# Patient Record
Sex: Male | Born: 1965
Health system: Southern US, Community
[De-identification: ages and names within clinical notes are randomized; demographics above are authoritative.]

## PROBLEM LIST (undated history)

## (undated) DIAGNOSIS — I10 Essential (primary) hypertension: Secondary | ICD-10-CM

## (undated) DIAGNOSIS — K219 Gastro-esophageal reflux disease without esophagitis: Secondary | ICD-10-CM

## (undated) DIAGNOSIS — E785 Hyperlipidemia, unspecified: Secondary | ICD-10-CM

## (undated) DIAGNOSIS — T7840XA Allergy, unspecified, initial encounter: Secondary | ICD-10-CM

## (undated) HISTORY — PX: VASECTOMY: SHX75

## (undated) HISTORY — DX: Allergy, unspecified, initial encounter: T78.40XA

## (undated) HISTORY — DX: Essential (primary) hypertension: I10

## (undated) HISTORY — PX: COLONOSCOPY WITH PROPOFOL: SHX5780

## (undated) HISTORY — DX: Hyperlipidemia, unspecified: E78.5

---

## 2011-05-13 ENCOUNTER — Ambulatory Visit: Payer: Self-pay | Admitting: Family Medicine

## 2014-09-06 ENCOUNTER — Ambulatory Visit: Payer: Self-pay | Admitting: Gastroenterology

## 2014-12-04 ENCOUNTER — Ambulatory Visit: Payer: Self-pay | Admitting: Family Medicine

## 2015-04-29 ENCOUNTER — Ambulatory Visit (INDEPENDENT_AMBULATORY_CARE_PROVIDER_SITE_OTHER): Payer: Managed Care, Other (non HMO) | Admitting: Family Medicine

## 2015-04-29 ENCOUNTER — Encounter: Payer: Self-pay | Admitting: Family Medicine

## 2015-04-29 VITALS — BP 152/91 | HR 79 | Temp 98.0°F | Ht 70.0 in | Wt 212.0 lb

## 2015-04-29 DIAGNOSIS — E785 Hyperlipidemia, unspecified: Secondary | ICD-10-CM | POA: Diagnosis not present

## 2015-04-29 DIAGNOSIS — I10 Essential (primary) hypertension: Secondary | ICD-10-CM

## 2015-04-29 DIAGNOSIS — S86911A Strain of unspecified muscle(s) and tendon(s) at lower leg level, right leg, initial encounter: Secondary | ICD-10-CM

## 2015-04-29 DIAGNOSIS — G479 Sleep disorder, unspecified: Secondary | ICD-10-CM

## 2015-04-29 DIAGNOSIS — R5383 Other fatigue: Secondary | ICD-10-CM

## 2015-04-29 DIAGNOSIS — S86811A Strain of other muscle(s) and tendon(s) at lower leg level, right leg, initial encounter: Secondary | ICD-10-CM | POA: Diagnosis not present

## 2015-04-29 DIAGNOSIS — G8929 Other chronic pain: Secondary | ICD-10-CM

## 2015-04-29 DIAGNOSIS — M25521 Pain in right elbow: Secondary | ICD-10-CM | POA: Diagnosis not present

## 2015-04-29 MED ORDER — AMLODIPINE BESYLATE 5 MG PO TABS: 5.0000 mg | ORAL_TABLET | Freq: Every day | ORAL | Status: DC

## 2015-04-29 MED ORDER — MELOXICAM 15 MG PO TABS: 15.0000 mg | ORAL_TABLET | Freq: Every day | ORAL | Status: DC

## 2015-04-29 NOTE — Assessment & Plan Note (Signed)
The current medical regimen is effective;  continue present plan and medications.  

## 2015-04-29 NOTE — Assessment & Plan Note (Signed)
Poor control off meds

## 2015-04-29 NOTE — Progress Notes (Signed)
BP 152/91 mmHg  Pulse 79  Temp(Src) 98 F (36.7 C)  Ht 5\' 10"  (1.778 m)  Wt 212 lb (96.163 kg)  BMI 30.42 kg/m2  SpO2 97%   Subjective:    Patient ID: David Barrera, male    DOB: 10/05/1966, 49 y.o.   MRN: [id number]  HPI: David Barrera is a 49 y.o. male  Chief Complaint  Patient presents with  . Hypertension    refills  . Knee Pain    right  . Elbow Pain    right   Out of amlodipine  This last week Does not know if helped did not check BP on meds No side effects  Did not get sleep study yet Will re order see note in practice partner  Knee 1 week medial swelling  No trauma sligh click  No giving way Worse with standing and gets stiff  Rt elbow clasic tennis elbow  Relevant past medical, surgical, family and social history reviewed and updated as indicated. Interim medical history since our last visit reviewed. Allergies and medications reviewed and updated.  Review of Systems  Constitutional: Negative.   Respiratory: Negative.   Cardiovascular: Negative.     Per HPI unless specifically indicated above     Objective:    BP 152/91 mmHg  Pulse 79  Temp(Src) 98 F (36.7 C)  Ht 5\' 10"  (1.778 m)  Wt 212 lb (96.163 kg)  BMI 30.42 kg/m2  SpO2 97%  Wt Readings from Last 3 Encounters:  04/29/15 212 lb (96.163 kg)  12/04/14 213 lb (96.616 kg)    Physical Exam  Constitutional: He is oriented to person, place, and time. He appears well-developed and well-nourished. No distress.  HENT:  Head: Normocephalic and atraumatic.  Right Ear: Hearing normal.  Left Ear: Hearing normal.  Nose: Nose normal.  Eyes: Conjunctivae and lids are normal. Right eye exhibits no discharge. Left eye exhibits no discharge. No scleral icterus.  Pulmonary/Chest: Effort normal. No respiratory distress.  Musculoskeletal: Normal range of motion.  Rt lateral pain with resistance Rt knee medial collateral  pain  Neurological: He is alert and oriented to person, place, and time.   Skin: Skin is intact. No rash noted.  Psychiatric: He has a normal mood and affect. His speech is normal and behavior is normal. Judgment and thought content normal. Cognition and memory are normal.    No results found for this or any previous visit.    Assessment & Plan:   Problem List Items Addressed This Visit      Cardiovascular and Mediastinum   Hypertension - Primary    Poor control off meds      Relevant Medications   amLODipine (NORVASC) 5 MG tablet     Other   Hyperlipidemia    The current medical regimen is effective;  continue present plan and medications.       Relevant Medications   amLODipine (NORVASC) 5 MG tablet    Other Visit Diagnoses    Fatigue due to sleep pattern disturbance        apt for sleep study as sx most likley OSA    Relevant Orders    Ambulatory referral to Sleep Studies    Elbow pain, chronic, right        lat epicondlitis discussed care and tx    Relevant Medications    meloxicam (MOBIC) 15 MG tablet    Knee strain, right, initial encounter        discussed care and  tx    Relevant Medications    meloxicam (MOBIC) 15 MG tablet        Follow up plan: Return in about 3 months (around 07/30/2015) for Physical Exam.

## 2015-05-02 ENCOUNTER — Other Ambulatory Visit: Payer: Self-pay | Admitting: Family Medicine

## 2015-06-12 ENCOUNTER — Telehealth: Payer: Self-pay

## 2015-06-12 NOTE — Telephone Encounter (Signed)
Patient has not heard about his sleep study, please follow up.  Thanks

## 2015-06-12 NOTE — Telephone Encounter (Signed)
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

## 2015-06-22 ENCOUNTER — Other Ambulatory Visit: Payer: Self-pay | Admitting: Family Medicine

## 2015-06-24 ENCOUNTER — Ambulatory Visit (INDEPENDENT_AMBULATORY_CARE_PROVIDER_SITE_OTHER): Payer: Managed Care, Other (non HMO) | Admitting: Family Medicine

## 2015-06-24 ENCOUNTER — Encounter: Payer: Self-pay | Admitting: Family Medicine

## 2015-06-24 VITALS — BP 126/83 | HR 89 | Temp 97.4°F | Wt 214.8 lb

## 2015-06-24 DIAGNOSIS — M25561 Pain in right knee: Secondary | ICD-10-CM | POA: Diagnosis not present

## 2015-06-24 MED ORDER — PREDNISONE 10 MG PO TABS: ORAL_TABLET | ORAL | Status: DC

## 2015-06-24 NOTE — Progress Notes (Signed)
BP 126/83 mmHg  Pulse 89  Temp(Src) 97.4 F (36.3 C)  Wt 214 lb 12.8 oz (97.433 kg)  SpO2 98%   Subjective:    Patient ID: David Barrera, male    DOB: Jul 29, 1966, 49 y.o.   MRN: [id number]  HPI: David Barrera is a 49 y.o. male  Chief Complaint  Patient presents with  . Knee Pain    right knee, he has seen Dr.Crissman for this, he was given Meloxicam. He states that the pain is sharpe at times.   KNEE PAIN Duration: 1 months Involved knee: right Mechanism of injury: unknown Location:medial Onset: sudden Severity: moderate worse with standing and walking Quality:  sharp Frequency: constant Radiation: yes Aggravating factors: weight bearing  Alleviating factors: nothing  Status: stable Treatments attempted: meloxicam and rest  Relief with NSAIDs?:  no Weakness with weight bearing or walking: no Sensation of giving way: no Locking: no Popping: no Bruising: no Swelling: yes Redness: no Paresthesias/decreased sensation: no Fevers: no  Relevant past medical, surgical, family and social history reviewed and updated as indicated. Interim medical history since our last visit reviewed. Allergies and medications reviewed and updated.  Review of Systems  Constitutional: Negative.   Respiratory: Negative.   Cardiovascular: Negative.   Musculoskeletal: Positive for arthralgias. Negative for myalgias, back pain, joint swelling, gait problem, neck pain and neck stiffness.  Psychiatric/Behavioral: Negative.     Per HPI unless specifically indicated above     Objective:    BP 126/83 mmHg  Pulse 89  Temp(Src) 97.4 F (36.3 C)  Wt 214 lb 12.8 oz (97.433 kg)  SpO2 98%  Wt Readings from Last 3 Encounters:  06/24/15 214 lb 12.8 oz (97.433 kg)  04/29/15 212 lb (96.163 kg)  12/04/14 213 lb (96.616 kg)    Physical Exam  Constitutional: He is oriented to person, place, and time. He appears well-developed and well-nourished. No distress.  HENT:  Head: Normocephalic and  atraumatic.  Right Ear: Hearing normal.  Left Ear: Hearing normal.  Nose: Nose normal.  Eyes: Conjunctivae and lids are normal. Right eye exhibits no discharge. Left eye exhibits no discharge. No scleral icterus.  Pulmonary/Chest: Effort normal. No respiratory distress.  Musculoskeletal: He exhibits tenderness.       Right knee: He exhibits normal range of motion, no swelling, no effusion, no ecchymosis, no deformity, no laceration, no erythema, normal alignment, no LCL laxity, normal patellar mobility, no bony tenderness, normal meniscus and no MCL laxity. Tenderness found. MCL tenderness noted. No medial joint line, no lateral joint line, no LCL and no patellar tendon tenderness noted.       Legs: Neurological: He is alert and oriented to person, place, and time.  Skin: Skin is warm, dry and intact. No rash noted. No erythema. No pallor.  Psychiatric: He has a normal mood and affect. His speech is normal and behavior is normal. Judgment and thought content normal. Cognition and memory are normal.  Nursing note and vitals reviewed.   No results found for this or any previous visit.    Assessment & Plan:   Problem List Items Addressed This Visit      Other   Right knee pain - Primary    Seems to be right at hamstring attachement. Not better on meloxicam. Will do prednisone taper. Will do stretches- given today. If better and returns consider injection at hamstring attachment. No need for x-ray at this time. Continue to monitor. Return if not improving.  Follow up plan: Return if symptoms worsen or fail to improve.

## 2015-06-24 NOTE — Patient Instructions (Signed)
Knee Exercises EXERCISES RANGE OF MOTION (ROM) AND STRETCHING EXERCISES These exercises may help you when beginning to rehabilitate your injury. Your symptoms may resolve with or without further involvement from your physician, physical therapist, or athletic trainer. While completing these exercises, remember:   Restoring tissue flexibility helps normal motion to return to the joints. This allows healthier, less painful movement and activity.  An effective stretch should be held for at least 30 seconds.  A stretch should never be painful. You should only feel a gentle lengthening or release in the stretched tissue. STRETCH - Knee Extension, Prone  Lie on your stomach on a firm surface, such as a bed or countertop. Place your right / left knee and leg just beyond the edge of the surface. You may wish to place a towel under the far end of your right / left thigh for comfort.  Relax your leg muscles and allow gravity to straighten your knee. Your clinician may advise you to add an ankle weight if more resistance is helpful for you.  You should feel a stretch in the back of your right / left knee. Hold this position for __________ seconds. Repeat __________ times. Complete this stretch __________ times per day. * Your physician, physical therapist, or athletic trainer may ask you to add ankle weight to enhance your stretch.  RANGE OF MOTION - Knee Flexion, Active  Lie on your back with both knees straight. (If this causes back discomfort, bend your opposite knee, placing your foot flat on the floor.)  Slowly slide your heel back toward your buttocks until you feel a gentle stretch in the front of your knee or thigh.  Hold for __________ seconds. Slowly slide your heel back to the starting position. Repeat __________ times. Complete this exercise __________ times per day.  STRETCH - Quadriceps, Prone   Lie on your stomach on a firm surface, such as a bed or padded floor.  Bend your right /  left knee and grasp your ankle. If you are unable to reach your ankle or pant leg, use a belt around your foot to lengthen your reach.  Gently pull your heel toward your buttocks. Your knee should not slide out to the side. You should feel a stretch in the front of your thigh and/or knee.  Hold this position for __________ seconds. Repeat __________ times. Complete this stretch __________ times per day.  STRETCH - Hamstrings, Supine   Lie on your back. Loop a belt or towel over the ball of your right / left foot.  Straighten your right / left knee and slowly pull on the belt to raise your leg. Do not allow the right / left knee to bend. Keep your opposite leg flat on the floor.  Raise the leg until you feel a gentle stretch behind your right / left knee or thigh. Hold this position for __________ seconds. Repeat __________ times. Complete this stretch __________ times per day.  STRENGTHENING EXERCISES These exercises may help you when beginning to rehabilitate your injury. They may resolve your symptoms with or without further involvement from your physician, physical therapist, or athletic trainer. While completing these exercises, remember:   Muscles can gain both the endurance and the strength needed for everyday activities through controlled exercises.  Complete these exercises as instructed by your physician, physical therapist, or athletic trainer. Progress the resistance and repetitions only as guided.  You may experience muscle soreness or fatigue, but the pain or discomfort you are trying to eliminate   should never worsen during these exercises. If this pain does worsen, stop and make certain you are following the directions exactly. If the pain is still present after adjustments, discontinue the exercise until you can discuss the trouble with your clinician. STRENGTH - Quadriceps, Isometrics  Lie on your back with your right / left leg extended and your opposite knee  bent.  Gradually tense the muscles in the front of your right / left thigh. You should see either your knee cap slide up toward your hip or increased dimpling just above the knee. This motion will push the back of the knee down toward the floor/mat/bed on which you are lying.  Hold the muscle as tight as you can without increasing your pain for __________ seconds.  Relax the muscles slowly and completely in between each repetition. Repeat __________ times. Complete this exercise __________ times per day.  STRENGTH - Quadriceps, Short Arcs   Lie on your back. Place a __________ inch towel roll under your knee so that the knee slightly bends.  Raise only your lower leg by tightening the muscles in the front of your thigh. Do not allow your thigh to rise.  Hold this position for __________ seconds. Repeat __________ times. Complete this exercise __________ times per day.  OPTIONAL ANKLE WEIGHTS: Begin with ____________________, but DO NOT exceed ____________________. Increase in 1 pound/0.5 kilogram increments.  STRENGTH - Quadriceps, Straight Leg Raises  Quality counts! Watch for signs that the quadriceps muscle is working to insure you are strengthening the correct muscles and not "cheating" by substituting with healthier muscles.  Lay on your back with your right / left leg extended and your opposite knee bent.  Tense the muscles in the front of your right / left thigh. You should see either your knee cap slide up or increased dimpling just above the knee. Your thigh may even quiver.  Tighten these muscles even more and raise your leg 4 to 6 inches off the floor. Hold for __________ seconds.  Keeping these muscles tense, lower your leg.  Relax the muscles slowly and completely in between each repetition. Repeat __________ times. Complete this exercise __________ times per day.  STRENGTH - Hamstring, Curls  Lay on your stomach with your legs extended. (If you lay on a bed, your feet  may hang over the edge.)  Tighten the muscles in the back of your thigh to bend your right / left knee up to 90 degrees. Keep your hips flat on the bed/floor.  Hold this position for __________ seconds.  Slowly lower your leg back to the starting position. Repeat __________ times. Complete this exercise __________ times per day.  OPTIONAL ANKLE WEIGHTS: Begin with ____________________, but DO NOT exceed ____________________. Increase in 1 pound/0.5 kilogram increments.  STRENGTH - Quadriceps, Squats  Stand in a door frame so that your feet and knees are in line with the frame.  Use your hands for balance, not support, on the frame.  Slowly lower your weight, bending at the hips and knees. Keep your lower legs upright so that they are parallel with the door frame. Squat only within the range that does not increase your knee pain. Never let your hips drop below your knees.  Slowly return upright, pushing with your legs, not pulling with your hands. Repeat __________ times. Complete this exercise __________ times per day.  STRENGTH - Quadriceps, Wall Slides  Follow guidelines for form closely. Increased knee pain often results from poorly placed feet or knees.  Lean   against a smooth wall or door and walk your feet out 18-24 inches. Place your feet hip-width apart.  Slowly slide down the wall or door until your knees bend __________ degrees.* Keep your knees over your heels, not your toes, and in line with your hips, not falling to either side.  Hold for __________ seconds. Stand up to rest for __________ seconds in between each repetition. Repeat __________ times. Complete this exercise __________ times per day. * Your physician, physical therapist, or athletic trainer will alter this angle based on your symptoms and progress. Document Released: 09/01/2005 Document Revised: 03/04/2014 Document Reviewed: 01/30/2009 [employer] Patient Information 2015 [employer]. This information is not  intended to replace advice given to you by your health care provider. Make sure you discuss any questions you have with your health care provider.  

## 2015-06-24 NOTE — Assessment & Plan Note (Signed)
Seems to be right at hamstring attachement. Not better on meloxicam. Will do prednisone taper. Will do stretches- given today. If better and returns consider injection at hamstring attachment. No need for x-ray at this time. Continue to monitor. Return if not improving.

## 2015-07-30 ENCOUNTER — Telehealth: Payer: Self-pay | Admitting: Family Medicine

## 2015-07-30 NOTE — Telephone Encounter (Signed)
Call patient regarding sleep study on my desk

## 2015-07-31 ENCOUNTER — Encounter: Payer: Self-pay | Admitting: Family Medicine

## 2015-07-31 ENCOUNTER — Ambulatory Visit (INDEPENDENT_AMBULATORY_CARE_PROVIDER_SITE_OTHER): Payer: Managed Care, Other (non HMO) | Admitting: Family Medicine

## 2015-07-31 VITALS — BP 135/95 | HR 80 | Temp 98.2°F | Ht 69.5 in | Wt 216.6 lb

## 2015-07-31 DIAGNOSIS — E785 Hyperlipidemia, unspecified: Secondary | ICD-10-CM | POA: Diagnosis not present

## 2015-07-31 DIAGNOSIS — M109 Gout, unspecified: Secondary | ICD-10-CM | POA: Insufficient documentation

## 2015-07-31 DIAGNOSIS — M1A00X0 Idiopathic chronic gout, unspecified site, without tophus (tophi): Secondary | ICD-10-CM

## 2015-07-31 DIAGNOSIS — I10 Essential (primary) hypertension: Secondary | ICD-10-CM | POA: Diagnosis not present

## 2015-07-31 DIAGNOSIS — K219 Gastro-esophageal reflux disease without esophagitis: Secondary | ICD-10-CM | POA: Diagnosis not present

## 2015-07-31 DIAGNOSIS — Z0000 Encounter for general adult medical examination without abnormal findings: Secondary | ICD-10-CM

## 2015-07-31 LAB — URINALYSIS, ROUTINE W REFLEX MICROSCOPIC
Bilirubin, UA: NEGATIVE
Glucose, UA: NEGATIVE
Ketones, UA: NEGATIVE
Leukocytes, UA: NEGATIVE
Nitrite, UA: NEGATIVE
Protein, UA: NEGATIVE
RBC, UA: NEGATIVE
Specific Gravity, UA: 1.005 (ref 1.005–1.030)
Urobilinogen, Ur: 0.2 mg/dL (ref 0.2–1.0)
pH, UA: 6.5 (ref 5.0–7.5)

## 2015-07-31 MED ORDER — ATORVASTATIN CALCIUM 20 MG PO TABS: 20.0000 mg | ORAL_TABLET | Freq: Every day | ORAL | Status: DC

## 2015-07-31 MED ORDER — OMEPRAZOLE 20 MG PO CPDR: 20.0000 mg | DELAYED_RELEASE_CAPSULE | Freq: Every day | ORAL | Status: DC

## 2015-07-31 MED ORDER — ALLOPURINOL 100 MG PO TABS: 100.0000 mg | ORAL_TABLET | Freq: Every day | ORAL | Status: DC

## 2015-07-31 MED ORDER — BENAZEPRIL HCL 40 MG PO TABS: 40.0000 mg | ORAL_TABLET | Freq: Every day | ORAL | Status: DC

## 2015-07-31 MED ORDER — ZOLPIDEM TARTRATE 10 MG PO TABS: 10.0000 mg | ORAL_TABLET | Freq: Every evening | ORAL | Status: DC | PRN

## 2015-07-31 MED ORDER — AMLODIPINE BESYLATE 5 MG PO TABS: 5.0000 mg | ORAL_TABLET | Freq: Every day | ORAL | Status: DC

## 2015-07-31 NOTE — Assessment & Plan Note (Signed)
The current medical regimen is effective;  continue present plan and medications.  

## 2015-07-31 NOTE — Progress Notes (Signed)
BP 135/95 mmHg  Pulse 80  Temp(Src) 98.2 F (36.8 C)  Ht 5' 9.5" (1.765 m)  Wt 216 lb 9.6 oz (98.249 kg)  BMI 31.54 kg/m2  SpO2 99%   Subjective:    Patient ID: David Barrera, male    DOB: 1966/01/05, 49 y.o.   MRN: [id number]  HPI: David Barrera is a 49 y.o. male  Chief Complaint  Patient presents with  . Annual Exam   patient doing well still having some right upper mid calf discomfort medicine as prescribed above has not really helped. No swelling noted just nonspecific soreness when he stands up. Otherwise no discomfort.  Patient reviewed sleep apnea study and had poor sleep with hypoxia down to 76 after discussion with Dr.Dellabadela yesterday we will reschedule sleep study with an Ambien to further evaluate. Hopefully split night study will be able to be performed if necessary.  Patient's blood pressure cholesterol doing well No gout issues no side effects from medications Heartburn medication has cured his cough.  Relevant past medical, surgical, family and social history reviewed and updated as indicated. Interim medical history since our last visit reviewed. Allergies and medications reviewed and updated.  Review of Systems  Constitutional: Negative.   HENT: Negative.   Eyes: Negative.   Respiratory: Negative.   Cardiovascular: Negative.   Gastrointestinal: Negative.   Endocrine: Negative.   Genitourinary: Negative.   Musculoskeletal: Negative.   Skin: Negative.   Allergic/Immunologic: Negative.   Neurological: Negative.   Hematological: Negative.   Psychiatric/Behavioral: Negative.     Per HPI unless specifically indicated above     Objective:    BP 135/95 mmHg  Pulse 80  Temp(Src) 98.2 F (36.8 C)  Ht 5' 9.5" (1.765 m)  Wt 216 lb 9.6 oz (98.249 kg)  BMI 31.54 kg/m2  SpO2 99%  Wt Readings from Last 3 Encounters:  07/31/15 216 lb 9.6 oz (98.249 kg)  06/24/15 214 lb 12.8 oz (97.433 kg)  04/29/15 212 lb (96.163 kg)    Physical Exam   Constitutional: He is oriented to person, place, and time. He appears well-developed and well-nourished.  HENT:  Head: Normocephalic and atraumatic.  Right Ear: External ear normal.  Left Ear: External ear normal.  Eyes: Conjunctivae and EOM are normal. Pupils are equal, round, and reactive to light.  Neck: Normal range of motion. Neck supple.  Cardiovascular: Normal rate, regular rhythm, normal heart sounds and intact distal pulses.   Pulmonary/Chest: Effort normal and breath sounds normal.  Abdominal: Soft. Bowel sounds are normal. There is no splenomegaly or hepatomegaly.  Genitourinary: Rectum normal, prostate normal and penis normal.  Musculoskeletal: Normal range of motion.  Neurological: He is alert and oriented to person, place, and time. He has normal reflexes.  Skin: No rash noted. No erythema.  Psychiatric: He has a normal mood and affect. His behavior is normal. Judgment and thought content normal.    No results found for this or any previous visit.    Assessment & Plan:   Problem List Items Addressed This Visit      Cardiovascular and Mediastinum   Hypertension    The current medical regimen is effective;  continue present plan and medications.       Relevant Medications   benazepril (LOTENSIN) 40 MG tablet   atorvastatin (LIPITOR) 20 MG tablet   amLODipine (NORVASC) 5 MG tablet     Digestive   Acid reflux   Relevant Medications   omeprazole (PRILOSEC) 20 MG capsule  Other   Hyperlipidemia    The current medical regimen is effective;  continue present plan and medications.       Relevant Medications   benazepril (LOTENSIN) 40 MG tablet   atorvastatin (LIPITOR) 20 MG tablet   amLODipine (NORVASC) 5 MG tablet   Gout   Relevant Medications   allopurinol (ZYLOPRIM) 100 MG tablet   Other Relevant Orders   Uric acid    Other Visit Diagnoses    PE (physical exam), annual    -  Primary    Relevant Orders    Comprehensive metabolic panel    Lipid  panel    CBC with Differential/Platelet    PSA    Urinalysis, Routine w reflex microscopic (not at [facility])    TSH      follow-up sleep study with Ambien ordered  Follow up plan: Return in about 6 months (around 01/28/2016), or if symptoms worsen or fail to improve, for med check, bmp, lipids, alt, ast.

## 2015-08-01 LAB — CBC WITH DIFFERENTIAL/PLATELET
Basophils Absolute: 0 10*3/uL (ref 0.0–0.2)
Basos: 1 %
EOS (ABSOLUTE): 0.1 10*3/uL (ref 0.0–0.4)
Eos: 2 %
Hematocrit: 37.9 % (ref 37.5–51.0)
Hemoglobin: 13 g/dL (ref 12.6–17.7)
Immature Grans (Abs): 0 10*3/uL (ref 0.0–0.1)
Immature Granulocytes: 0 %
Lymphocytes Absolute: 1.9 10*3/uL (ref 0.7–3.1)
Lymphs: 42 %
MCH: 31.3 pg (ref 26.6–33.0)
MCHC: 34.3 g/dL (ref 31.5–35.7)
MCV: 91 fL (ref 79–97)
Monocytes Absolute: 0.3 10*3/uL (ref 0.1–0.9)
Monocytes: 8 %
Neutrophils Absolute: 2.1 10*3/uL (ref 1.4–7.0)
Neutrophils: 47 %
Platelets: 236 10*3/uL (ref 150–379)
RBC: 4.16 x10E6/uL (ref 4.14–5.80)
RDW: 13.8 % (ref 12.3–15.4)
WBC: 4.4 10*3/uL (ref 3.4–10.8)

## 2015-08-01 LAB — TSH: TSH: 1.51 u[IU]/mL (ref 0.450–4.500)

## 2015-08-01 LAB — COMPREHENSIVE METABOLIC PANEL
ALT: 28 IU/L (ref 0–44)
AST: 24 IU/L (ref 0–40)
Albumin/Globulin Ratio: 2.2 (ref 1.1–2.5)
Albumin: 4.8 g/dL (ref 3.5–5.5)
Alkaline Phosphatase: 55 IU/L (ref 39–117)
BUN/Creatinine Ratio: 15 (ref 9–20)
BUN: 11 mg/dL (ref 6–24)
Bilirubin Total: 0.7 mg/dL (ref 0.0–1.2)
CO2: 26 mmol/L (ref 18–29)
Calcium: 9.5 mg/dL (ref 8.7–10.2)
Chloride: 97 mmol/L (ref 97–108)
Creatinine, Ser: 0.72 mg/dL — ABNORMAL LOW (ref 0.76–1.27)
GFR calc Af Amer: 125 mL/min/{1.73_m2} (ref >59)
GFR calc non Af Amer: 108 mL/min/{1.73_m2} (ref >59)
Globulin, Total: 2.2 g/dL (ref 1.5–4.5)
Glucose: 92 mg/dL (ref 65–99)
Potassium: 4 mmol/L (ref 3.5–5.2)
Sodium: 141 mmol/L (ref 134–144)
Total Protein: 7 g/dL (ref 6.0–8.5)

## 2015-08-01 LAB — LIPID PANEL
Chol/HDL Ratio: 4.8 ratio units (ref 0.0–5.0)
Cholesterol, Total: 250 mg/dL — ABNORMAL HIGH (ref 100–199)
HDL: 52 mg/dL (ref >39)
Triglycerides: 676 mg/dL (ref 0–149)

## 2015-08-01 LAB — PSA: Prostate Specific Ag, Serum: 1.1 ng/mL (ref 0.0–4.0)

## 2015-08-01 LAB — URIC ACID: Uric Acid: 5.7 mg/dL (ref 3.7–8.6)

## 2015-08-04 ENCOUNTER — Telehealth: Payer: Self-pay | Admitting: Family Medicine

## 2015-08-04 DIAGNOSIS — E7800 Pure hypercholesterolemia, unspecified: Secondary | ICD-10-CM

## 2015-08-04 NOTE — Telephone Encounter (Signed)
Phone call Discussed with patient markedly elevated cholesterol and triglycerides. Patient states his taken Lipitor faithfully. We'll recheck lipid panel near future fasting

## 2015-08-04 NOTE — Telephone Encounter (Signed)
-----   Message from Lurlean Horns, CMA sent at 08/04/2015 12:12 PM EDT ----- labs

## 2015-08-13 ENCOUNTER — Other Ambulatory Visit: Payer: Managed Care, Other (non HMO)

## 2015-08-13 DIAGNOSIS — E7800 Pure hypercholesterolemia, unspecified: Secondary | ICD-10-CM

## 2015-08-14 ENCOUNTER — Telehealth: Payer: Self-pay | Admitting: Family Medicine

## 2015-08-14 LAB — LIPID PANEL
Chol/HDL Ratio: 4.1 ratio units (ref 0.0–5.0)
Cholesterol, Total: 215 mg/dL — ABNORMAL HIGH (ref 100–199)
HDL: 52 mg/dL (ref >39)
Triglycerides: 467 mg/dL — ABNORMAL HIGH (ref 0–149)

## 2015-08-14 NOTE — Telephone Encounter (Signed)
Phone call Discussed with patient triglycerides still elevated patient really hasn't adjusted carbohydrate diet discussed risk of pancreatitis potential for treatment with medication patient indicates he wants to do serious diet. He has an appointment this January we'll recheck lipid panel at that time.

## 2015-08-14 NOTE — Telephone Encounter (Signed)
-----   Message from Lurlean HornsNancy H Wilson, CMA sent at 08/14/2015  5:20 PM EDT ----- Labs/ line [id number]

## 2015-09-07 ENCOUNTER — Other Ambulatory Visit: Payer: Self-pay | Admitting: Family Medicine

## 2016-01-19 ENCOUNTER — Encounter: Payer: Self-pay | Admitting: Family Medicine

## 2016-01-19 ENCOUNTER — Ambulatory Visit (INDEPENDENT_AMBULATORY_CARE_PROVIDER_SITE_OTHER): Payer: Managed Care, Other (non HMO) | Admitting: Family Medicine

## 2016-01-19 VITALS — BP 142/96 | HR 92 | Temp 97.9°F | Ht 69.7 in | Wt 215.0 lb

## 2016-01-19 DIAGNOSIS — M1A00X0 Idiopathic chronic gout, unspecified site, without tophus (tophi): Secondary | ICD-10-CM | POA: Diagnosis not present

## 2016-01-19 DIAGNOSIS — R1011 Right upper quadrant pain: Secondary | ICD-10-CM

## 2016-01-19 DIAGNOSIS — I10 Essential (primary) hypertension: Secondary | ICD-10-CM

## 2016-01-19 DIAGNOSIS — Z113 Encounter for screening for infections with a predominantly sexual mode of transmission: Secondary | ICD-10-CM | POA: Diagnosis not present

## 2016-01-19 DIAGNOSIS — E785 Hyperlipidemia, unspecified: Secondary | ICD-10-CM | POA: Diagnosis not present

## 2016-01-19 DIAGNOSIS — R109 Unspecified abdominal pain: Secondary | ICD-10-CM | POA: Insufficient documentation

## 2016-01-19 DIAGNOSIS — R1010 Upper abdominal pain, unspecified: Secondary | ICD-10-CM | POA: Insufficient documentation

## 2016-01-19 NOTE — Assessment & Plan Note (Addendum)
Triglycerides to high to measure

## 2016-01-19 NOTE — Progress Notes (Signed)
BP 142/96 mmHg  Pulse 92  Temp(Src) 97.9 F (36.6 C)  Ht 5' 9.7" (1.77 m)  Wt 215 lb (97.523 kg)  BMI 31.13 kg/m2  SpO2 99%   Subjective:    Patient ID: David Barrera, male    DOB: 08/06/1966, 50 y.o.   MRN: [id number]  HPI: David Barrera is a 50 y.o. male  Chief Complaint  Patient presents with  . Hypertension    no meds this morning  . Hyperlipidemia   patient follow-up blood pressure cholesterol no complaints from medications ran out of amlodipine and hasn't taking Benzapril either today. Blood pressure is up. All taking medications Blood pressure is been good. No complaints from the Lipitor or allopurinol with no gout symptoms or signs. Some right upper quadrant abdominal pain nonspecific more intermittent having more loose stools and constipation no radiating pain to his back not associated with foods No blood in stool or urine is been ongoing for 2 months.   Relevant past medical, surgical, family and social history reviewed and updated as indicated. Interim medical history since our last visit reviewed. Allergies and medications reviewed and updated.  Review of Systems  Constitutional: Negative.   Respiratory: Negative.   Cardiovascular: Negative.     Per HPI unless specifically indicated above     Objective:    BP 142/96 mmHg  Pulse 92  Temp(Src) 97.9 F (36.6 C)  Ht 5' 9.7" (1.77 m)  Wt 215 lb (97.523 kg)  BMI 31.13 kg/m2  SpO2 99%  Wt Readings from Last 3 Encounters:  01/19/16 215 lb (97.523 kg)  07/31/15 216 lb 9.6 oz (98.249 kg)  06/24/15 214 lb 12.8 oz (97.433 kg)    Physical Exam  Constitutional: He is oriented to person, place, and time. He appears well-developed and well-nourished. No distress.  HENT:  Head: Normocephalic and atraumatic.  Right Ear: Hearing normal.  Left Ear: Hearing normal.  Nose: Nose normal.  Eyes: Conjunctivae and lids are normal. Right eye exhibits no discharge. Left eye exhibits no discharge. No scleral icterus.   Cardiovascular: Normal rate, regular rhythm and normal heart sounds.   Pulmonary/Chest: Effort normal and breath sounds normal. No respiratory distress.  Musculoskeletal: Normal range of motion.  Neurological: He is alert and oriented to person, place, and time.  Skin: Skin is intact. No rash noted.  Psychiatric: He has a normal mood and affect. His speech is normal and behavior is normal. Judgment and thought content normal. Cognition and memory are normal.    Results for orders placed or performed in visit on 08/13/15  Lipid panel  Result Value Ref Range   Cholesterol, Total 215 (H) 100 - 199 mg/dL   Triglycerides 629 (H) 0 - 149 mg/dL   HDL 52 >52 mg/dL   VLDL Cholesterol Cal Comment 5 - 40 mg/dL   LDL Calculated Comment 0 - 99 mg/dL   Chol/HDL Ratio 4.1 0.0 - 5.0 ratio units      Assessment & Plan:   Problem List Items Addressed This Visit      Cardiovascular and Mediastinum   Hypertension    Poor control today but patient not taking medications will recheck on medications.      Relevant Orders   LP+ALT+AST Piccolo, Waived   Basic metabolic panel     Other   Hyperlipidemia - Primary    Triglycerides to high to measure      Relevant Orders   LP+ALT+AST Piccolo, [employer]   Basic metabolic panel  Gout    The current medical regimen is effective;  continue present plan and medications.       Relevant Orders   Uric acid   Abdominal pain    Abdominal pain nonspecific concerned may be low-grade pancreatitis will check amylase lipase discuss low triglyceride diet.      Relevant Orders   Amylase   Lipase    Other Visit Diagnoses    Routine screening for STI (sexually transmitted infection)        Relevant Orders    Hepatitis C Antibody        Follow up plan: Return in about 6 months (around 07/21/2016), or if symptoms worsen or fail to improve, for PE, Physical Exam.

## 2016-01-19 NOTE — Assessment & Plan Note (Signed)
Poor control today but patient not taking medications will recheck on medications.

## 2016-01-19 NOTE — Assessment & Plan Note (Signed)
Abdominal pain nonspecific concerned may be low-grade pancreatitis will check amylase lipase discuss low triglyceride diet.

## 2016-01-19 NOTE — Assessment & Plan Note (Signed)
The current medical regimen is effective;  continue present plan and medications.  

## 2016-01-20 LAB — LIPASE: Lipase: 21 U/L (ref 0–59)

## 2016-01-20 LAB — BASIC METABOLIC PANEL
BUN/Creatinine Ratio: 25 — ABNORMAL HIGH (ref 9–20)
BUN: 17 mg/dL (ref 6–24)
CO2: 26 mmol/L (ref 18–29)
Calcium: 9.6 mg/dL (ref 8.7–10.2)
Chloride: 94 mmol/L — ABNORMAL LOW (ref 96–106)
Creatinine, Ser: 0.67 mg/dL — ABNORMAL LOW (ref 0.76–1.27)
GFR calc Af Amer: 129 mL/min/{1.73_m2} (ref >59)
GFR calc non Af Amer: 111 mL/min/{1.73_m2} (ref >59)
Glucose: 98 mg/dL (ref 65–99)
Potassium: 4.6 mmol/L (ref 3.5–5.2)
Sodium: 136 mmol/L (ref 134–144)

## 2016-01-20 LAB — HCV ANTIBODY: Hep C Virus Ab: <0.1 s/co ratio (ref 0.0–0.9)

## 2016-01-20 LAB — URIC ACID: Uric Acid: 5.8 mg/dL (ref 3.7–8.6)

## 2016-01-20 LAB — AMYLASE: Amylase: 74 U/L (ref 31–124)

## 2016-01-21 ENCOUNTER — Telehealth: Payer: Self-pay | Admitting: Family Medicine

## 2016-01-21 DIAGNOSIS — E785 Hyperlipidemia, unspecified: Secondary | ICD-10-CM

## 2016-01-21 LAB — LIPID PANEL W/O CHOL/HDL RATIO
Cholesterol, Total: 230 mg/dL — ABNORMAL HIGH (ref 100–199)
HDL: 42 mg/dL (ref >39)
Triglycerides: 993 mg/dL (ref 0–149)

## 2016-01-21 LAB — SPECIMEN STATUS REPORT

## 2016-01-21 MED ORDER — ATORVASTATIN CALCIUM 40 MG PO TABS: 20.0000 mg | ORAL_TABLET | Freq: Every day | ORAL | Status: DC

## 2016-01-21 NOTE — Telephone Encounter (Signed)
Please double check that he has been taking his cholesterol medicine. His trigylcerides were very very high. If he has, he should start taking 2 of the lipitor at night. If he hasn't, he should restart it. I'll send a refill though if needed. Please have him come back in 1 month for cholesterol recheck, and make sure that he comes in FASTING.

## 2016-01-21 NOTE — Telephone Encounter (Signed)
Increased to 40mg  daily. Rx sent to his pharmacy

## 2016-01-22 LAB — LP+ALT+AST PICCOLO, WAIVED
ALT (SGPT) Piccolo, Waived: 34 U/L (ref 10–47)
AST (SGOT) Piccolo, Waived: 39 U/L — ABNORMAL HIGH (ref 11–38)

## 2016-01-26 ENCOUNTER — Telehealth: Payer: Self-pay | Admitting: Family Medicine

## 2016-01-26 DIAGNOSIS — I10 Essential (primary) hypertension: Secondary | ICD-10-CM

## 2016-01-26 MED ORDER — AMLODIPINE BESYLATE 5 MG PO TABS: 5.0000 mg | ORAL_TABLET | Freq: Every day | ORAL | Status: DC

## 2016-01-26 NOTE — Telephone Encounter (Signed)
[facility]

## 2016-03-04 ENCOUNTER — Other Ambulatory Visit: Payer: Managed Care, Other (non HMO)

## 2016-03-04 DIAGNOSIS — E785 Hyperlipidemia, unspecified: Secondary | ICD-10-CM

## 2016-03-05 ENCOUNTER — Telehealth: Payer: Self-pay | Admitting: Family Medicine

## 2016-03-05 DIAGNOSIS — E785 Hyperlipidemia, unspecified: Secondary | ICD-10-CM

## 2016-03-05 LAB — LIPID PANEL W/O CHOL/HDL RATIO
Cholesterol, Total: 215 mg/dL — ABNORMAL HIGH (ref 100–199)
HDL: 56 mg/dL (ref >39)
Triglycerides: 414 mg/dL — ABNORMAL HIGH (ref 0–149)

## 2016-03-05 MED ORDER — ATORVASTATIN CALCIUM 40 MG PO TABS: 40.0000 mg | ORAL_TABLET | Freq: Every day | ORAL | Status: DC

## 2016-03-05 NOTE — Telephone Encounter (Signed)
Called patient, no answer, left a message for patient to return my call.  

## 2016-03-05 NOTE — Telephone Encounter (Signed)
Please let him know that his cholesterol is better, but still high. Please make sure he's taking 40mg  of his atorvastatin (a whole pill) and we'll recheck it at his follow up with Dr. Dossie Arbourrissman. Thanks!

## 2016-03-05 NOTE — Telephone Encounter (Signed)
Patient notified, verbal order from Dr.johnson for patient to take 1.5 tablets, if he gets body aches, he can go back to taking 1 tablet a day.

## 2016-07-15 ENCOUNTER — Encounter (INDEPENDENT_AMBULATORY_CARE_PROVIDER_SITE_OTHER): Payer: Self-pay

## 2016-08-05 ENCOUNTER — Ambulatory Visit (INDEPENDENT_AMBULATORY_CARE_PROVIDER_SITE_OTHER): Payer: Managed Care, Other (non HMO) | Admitting: Family Medicine

## 2016-08-05 ENCOUNTER — Encounter: Payer: Self-pay | Admitting: Family Medicine

## 2016-08-05 VITALS — BP 126/76 | HR 89 | Temp 98.2°F | Ht 70.1 in | Wt 215.4 lb

## 2016-08-05 DIAGNOSIS — Z0000 Encounter for general adult medical examination without abnormal findings: Secondary | ICD-10-CM | POA: Diagnosis not present

## 2016-08-05 DIAGNOSIS — E7800 Pure hypercholesterolemia, unspecified: Secondary | ICD-10-CM

## 2016-08-05 DIAGNOSIS — Z23 Encounter for immunization: Secondary | ICD-10-CM

## 2016-08-05 DIAGNOSIS — M1A00X0 Idiopathic chronic gout, unspecified site, without tophus (tophi): Secondary | ICD-10-CM

## 2016-08-05 DIAGNOSIS — R1011 Right upper quadrant pain: Secondary | ICD-10-CM

## 2016-08-05 DIAGNOSIS — I10 Essential (primary) hypertension: Secondary | ICD-10-CM

## 2016-08-05 DIAGNOSIS — K219 Gastro-esophageal reflux disease without esophagitis: Secondary | ICD-10-CM

## 2016-08-05 LAB — URINALYSIS, ROUTINE W REFLEX MICROSCOPIC
Bilirubin, UA: NEGATIVE
Glucose, UA: NEGATIVE
Ketones, UA: NEGATIVE
Leukocytes, UA: NEGATIVE
Nitrite, UA: NEGATIVE
Protein, UA: NEGATIVE
RBC, UA: NEGATIVE
Specific Gravity, UA: 1.005 (ref 1.005–1.030)
Urobilinogen, Ur: 0.2 mg/dL (ref 0.2–1.0)
pH, UA: 6.5 (ref 5.0–7.5)

## 2016-08-05 MED ORDER — AMLODIPINE BESYLATE 5 MG PO TABS: 5.0000 mg | ORAL_TABLET | Freq: Every day | ORAL | 4 refills | Status: DC

## 2016-08-05 MED ORDER — ALLOPURINOL 100 MG PO TABS: 100.0000 mg | ORAL_TABLET | Freq: Every day | ORAL | 4 refills | Status: DC

## 2016-08-05 MED ORDER — OMEPRAZOLE 20 MG PO CPDR: 20.0000 mg | DELAYED_RELEASE_CAPSULE | Freq: Every day | ORAL | 4 refills | Status: DC

## 2016-08-05 MED ORDER — BENAZEPRIL HCL 40 MG PO TABS: 40.0000 mg | ORAL_TABLET | Freq: Every day | ORAL | 4 refills | Status: DC

## 2016-08-05 NOTE — Assessment & Plan Note (Signed)
The current medical regimen is effective;  continue present plan and medications.  

## 2016-08-05 NOTE — Assessment & Plan Note (Signed)
Intermittent abdominal pain we will observe symptoms discussed diet weight loss elevated triglycerides patient had normal colonoscopy

## 2016-08-05 NOTE — Assessment & Plan Note (Signed)
Discussed cholesterol and recommended not to use red rice she's because of no safety studies Will await results of cholesterol and then review for appropriate medications

## 2016-08-05 NOTE — Progress Notes (Signed)
BP 126/76 (BP Location: Left Arm, Patient Position: Sitting, Cuff Size: Normal)   Pulse 89   Temp 98.2 F (36.8 C)   Ht 5' 10.1" (1.781 m)   Wt 215 lb 6.4 oz (97.7 kg)   SpO2 98%   BMI 30.82 kg/m    Subjective:    Patient ID: David Barrera, male    DOB: 08/20/1966, 50 y.o.   MRN: [id number]  HPI: David EarlsDonald W Sarria is a 50 y.o. male  Chief Complaint  Patient presents with  . Annual Exam   Patient feeling very well was having some myalgias from Lipitor stopping that is felt better than he has a long time. Has started a over-the-counter supplement containing red rice yeast for cholesterol and triglycerides Patient does have an intermittent right abdominal pain sometimes radiation down into his right lower quadrant not associated with meals or time of day day or night. Reflux well controlled No gout symptoms Relevant past medical, surgical, family and social history reviewed and updated as indicated. Interim medical history since our last visit reviewed. Allergies and medications reviewed and updated.  Review of Systems  Constitutional: Negative.   HENT: Negative.   Eyes: Negative.   Respiratory: Negative.   Cardiovascular: Negative.   Gastrointestinal: Negative.   Endocrine: Negative.   Genitourinary: Negative.   Musculoskeletal: Negative.   Skin: Negative.   Allergic/Immunologic: Negative.   Neurological: Negative.   Hematological: Negative.   Psychiatric/Behavioral: Negative.     Per HPI unless specifically indicated above     Objective:    BP 126/76 (BP Location: Left Arm, Patient Position: Sitting, Cuff Size: Normal)   Pulse 89   Temp 98.2 F (36.8 C)   Ht 5' 10.1" (1.781 m)   Wt 215 lb 6.4 oz (97.7 kg)   SpO2 98%   BMI 30.82 kg/m   Wt Readings from Last 3 Encounters:  08/05/16 215 lb 6.4 oz (97.7 kg)  01/19/16 215 lb (97.5 kg)  07/31/15 216 lb 9.6 oz (98.2 kg)    Physical Exam  Constitutional: He is oriented to person, place, and time. He appears  well-developed and well-nourished.  HENT:  Head: Normocephalic and atraumatic.  Right Ear: External ear normal.  Left Ear: External ear normal.  Eyes: Conjunctivae and EOM are normal. Pupils are equal, round, and reactive to light.  Neck: Normal range of motion. Neck supple.  Cardiovascular: Normal rate, regular rhythm, normal heart sounds and intact distal pulses.   Pulmonary/Chest: Effort normal and breath sounds normal.  Abdominal: Soft. Bowel sounds are normal. There is no splenomegaly or hepatomegaly.  Genitourinary: Rectum normal, prostate normal and penis normal.  Musculoskeletal: Normal range of motion.  Neurological: He is alert and oriented to person, place, and time. He has normal reflexes.  Skin: No rash noted. No erythema.  Psychiatric: He has a normal mood and affect. His behavior is normal. Judgment and thought content normal.    Results for orders placed or performed in visit on 03/04/16  Lipid Panel w/o Chol/HDL Ratio  Result Value Ref Range   Cholesterol, Total 215 (H) 100 - 199 mg/dL   Triglycerides 329414 (H) 0 - 149 mg/dL   HDL 56 >51>39 mg/dL   VLDL Cholesterol Cal Comment 5 - 40 mg/dL   LDL Calculated Comment 0 - 99 mg/dL      Assessment & Plan:   Problem List Items Addressed This Visit      Cardiovascular and Mediastinum   Hypertension    The current medical  regimen is effective;  continue present plan and medications.       Relevant Medications   benazepril (LOTENSIN) 40 MG tablet   amLODipine (NORVASC) 5 MG tablet     Digestive   Acid reflux   Relevant Medications   omeprazole (PRILOSEC) 20 MG capsule     Other   Hyperlipidemia    Discussed cholesterol and recommended not to use red rice she's because of no safety studies Will await results of cholesterol and then review for appropriate medications      Relevant Medications   benazepril (LOTENSIN) 40 MG tablet   amLODipine (NORVASC) 5 MG tablet   Gout    The current medical regimen is  effective;  continue present plan and medications.       Relevant Medications   allopurinol (ZYLOPRIM) 100 MG tablet   Abdominal pain    Intermittent abdominal pain we will observe symptoms discussed diet weight loss elevated triglycerides patient had normal colonoscopy       Other Visit Diagnoses    Need for influenza vaccination    -  Primary   Relevant Orders   Flu Vaccine QUAD 36+ mos PF IM (Fluarix & Fluzone Quad PF) (Completed)   Annual physical exam       Relevant Orders   Flu Vaccine QUAD 36+ mos PF IM (Fluarix & Fluzone Quad PF) (Completed)   CBC with Differential/Platelet   Comprehensive metabolic panel   Lipid panel   PSA   TSH   Urinalysis, Routine w reflex microscopic (not at [facility])       Follow up plan: Return in about 4 weeks (around 09/02/2016) for Lipids, ALT, AST reviewed in medications which will be started after seeing what blood work looks l.

## 2016-08-05 NOTE — Patient Instructions (Addendum)

## 2016-08-06 LAB — COMPREHENSIVE METABOLIC PANEL
ALT: 23 IU/L (ref 0–44)
AST: 22 IU/L (ref 0–40)
Albumin/Globulin Ratio: 1.9 (ref 1.2–2.2)
Albumin: 4.8 g/dL (ref 3.5–5.5)
Alkaline Phosphatase: 58 IU/L (ref 39–117)
BUN/Creatinine Ratio: 17 (ref 9–20)
BUN: 13 mg/dL (ref 6–24)
Bilirubin Total: 0.5 mg/dL (ref 0.0–1.2)
CO2: 23 mmol/L (ref 18–29)
Calcium: 10 mg/dL (ref 8.7–10.2)
Chloride: 93 mmol/L — ABNORMAL LOW (ref 96–106)
Creatinine, Ser: 0.76 mg/dL (ref 0.76–1.27)
GFR calc Af Amer: 121 mL/min/{1.73_m2} (ref >59)
GFR calc non Af Amer: 105 mL/min/{1.73_m2} (ref >59)
Globulin, Total: 2.5 g/dL (ref 1.5–4.5)
Glucose: 105 mg/dL — ABNORMAL HIGH (ref 65–99)
Potassium: 4.4 mmol/L (ref 3.5–5.2)
Sodium: 135 mmol/L (ref 134–144)
Total Protein: 7.3 g/dL (ref 6.0–8.5)

## 2016-08-06 LAB — LIPID PANEL
Chol/HDL Ratio: 6.1 ratio units — ABNORMAL HIGH (ref 0.0–5.0)
Cholesterol, Total: 312 mg/dL — ABNORMAL HIGH (ref 100–199)
HDL: 51 mg/dL (ref >39)
Triglycerides: 620 mg/dL (ref 0–149)

## 2016-08-06 LAB — CBC WITH DIFFERENTIAL/PLATELET
Basophils Absolute: 0 10*3/uL (ref 0.0–0.2)
Basos: 0 %
EOS (ABSOLUTE): 0.1 10*3/uL (ref 0.0–0.4)
Eos: 3 %
Hematocrit: 38.8 % (ref 37.5–51.0)
Hemoglobin: 13.4 g/dL (ref 12.6–17.7)
Immature Grans (Abs): 0 10*3/uL (ref 0.0–0.1)
Immature Granulocytes: 0 %
Lymphocytes Absolute: 1.8 10*3/uL (ref 0.7–3.1)
Lymphs: 40 %
MCH: 31 pg (ref 26.6–33.0)
MCHC: 34.5 g/dL (ref 31.5–35.7)
MCV: 90 fL (ref 79–97)
Monocytes Absolute: 0.5 10*3/uL (ref 0.1–0.9)
Monocytes: 10 %
Neutrophils Absolute: 2.1 10*3/uL (ref 1.4–7.0)
Neutrophils: 47 %
Platelets: 283 10*3/uL (ref 150–379)
RBC: 4.32 x10E6/uL (ref 4.14–5.80)
RDW: 13.1 % (ref 12.3–15.4)
WBC: 4.5 10*3/uL (ref 3.4–10.8)

## 2016-08-06 LAB — PSA: Prostate Specific Ag, Serum: 1.3 ng/mL (ref 0.0–4.0)

## 2016-08-06 LAB — TSH: TSH: 2.1 u[IU]/mL (ref 0.450–4.500)

## 2016-08-10 ENCOUNTER — Telehealth: Payer: Self-pay | Admitting: Family Medicine

## 2016-08-10 MED ORDER — ROSUVASTATIN CALCIUM 20 MG PO TABS: 20.0000 mg | ORAL_TABLET | Freq: Every day | ORAL | 3 refills | Status: DC

## 2016-08-10 NOTE — Telephone Encounter (Signed)
Pt called stated he is returning Dr. Christell Faithrissman's call. No further information provided. Thanks.

## 2016-08-10 NOTE — Telephone Encounter (Signed)
Phone call Discussed with patient markedly elevated cholesterol patient had been taking this herbal supplements including red rice yeast this is been stopped at office visit with elevated cholesterol will change to Crestor recheck 1 month with lipid panel, ALT AST.

## 2016-08-18 ENCOUNTER — Other Ambulatory Visit: Payer: Self-pay | Admitting: Family Medicine

## 2016-08-18 DIAGNOSIS — K219 Gastro-esophageal reflux disease without esophagitis: Secondary | ICD-10-CM

## 2016-09-05 ENCOUNTER — Other Ambulatory Visit: Payer: Self-pay | Admitting: Family Medicine

## 2016-09-05 DIAGNOSIS — I10 Essential (primary) hypertension: Secondary | ICD-10-CM

## 2016-09-16 ENCOUNTER — Ambulatory Visit: Payer: Managed Care, Other (non HMO) | Admitting: Family Medicine

## 2016-09-27 ENCOUNTER — Ambulatory Visit: Payer: Managed Care, Other (non HMO) | Admitting: Family Medicine

## 2016-10-06 ENCOUNTER — Ambulatory Visit: Payer: Managed Care, Other (non HMO) | Admitting: Family Medicine

## 2016-10-11 ENCOUNTER — Telehealth: Payer: Self-pay

## 2016-10-11 MED ORDER — ROSUVASTATIN CALCIUM 20 MG PO TABS: 20.0000 mg | ORAL_TABLET | Freq: Every day | ORAL | 4 refills | Status: DC

## 2016-10-11 NOTE — Telephone Encounter (Signed)
90 day supply request for Rosuvastatin 20mg  Cap. Last visit 08/05/2016

## 2016-10-12 ENCOUNTER — Ambulatory Visit (INDEPENDENT_AMBULATORY_CARE_PROVIDER_SITE_OTHER): Payer: Managed Care, Other (non HMO) | Admitting: Family Medicine

## 2016-10-12 VITALS — BP 127/86 | HR 91 | Temp 98.0°F | Ht 70.0 in | Wt 214.8 lb

## 2016-10-12 DIAGNOSIS — E785 Hyperlipidemia, unspecified: Secondary | ICD-10-CM | POA: Diagnosis not present

## 2016-10-12 DIAGNOSIS — I10 Essential (primary) hypertension: Secondary | ICD-10-CM | POA: Diagnosis not present

## 2016-10-12 DIAGNOSIS — R1011 Right upper quadrant pain: Secondary | ICD-10-CM | POA: Diagnosis not present

## 2016-10-12 LAB — LP+ALT+AST PICCOLO, WAIVED
ALT (SGPT) Piccolo, Waived: 31 U/L (ref 10–47)
AST (SGOT) Piccolo, Waived: 36 U/L (ref 11–38)
Cholesterol Piccolo, Waived: 224 mg/dL — ABNORMAL HIGH (ref <200)
Triglycerides Piccolo,Waived: >500 mg/dL — ABNORMAL HIGH (ref <150)

## 2016-10-12 MED ORDER — HYDROCHLOROTHIAZIDE 25 MG PO TABS: 25.0000 mg | ORAL_TABLET | Freq: Every day | ORAL | 4 refills | Status: DC

## 2016-10-12 NOTE — Assessment & Plan Note (Signed)
Patient's cholesterol down dramatically triglycerides patient nonfasting and elevated.

## 2016-10-12 NOTE — Progress Notes (Signed)
BP 127/86 (BP Location: Left Arm, Patient Position: Sitting, Cuff Size: Normal)   Pulse 91   Temp 98 F (36.7 C)   Ht 5\' 10"  (1.778 m)   Wt 214 lb 12.8 oz (97.4 kg)   SpO2 98%   BMI 30.82 kg/m    Subjective:    Patient ID: David Barrera, male    DOB: 1966/08/09, 50 y.o.   MRN: [id number]  HPI: David Barrera is a 50 y.o. male  Chief Complaint  Patient presents with  . Hyperlipidemia   Patient follow-up doing well with Crestor leg ache and symptoms have resolved and not restarted with Crestor. Patient's developed a nonspecific dry cough. No fever or chills or other cold upper respiratory symptoms. Patient's right upper quadrant abdominal pain still persists is not severe may be related to some dietary indiscretion but not sure.  Relevant past medical, surgical, family and social history reviewed and updated as indicated. Interim medical history since our last visit reviewed. Allergies and medications reviewed and updated.  Review of Systems  Constitutional: Negative.   Respiratory: Negative.   Cardiovascular: Negative.     Per HPI unless specifically indicated above     Objective:    BP 127/86 (BP Location: Left Arm, Patient Position: Sitting, Cuff Size: Normal)   Pulse 91   Temp 98 F (36.7 C)   Ht 5\' 10"  (1.778 m)   Wt 214 lb 12.8 oz (97.4 kg)   SpO2 98%   BMI 30.82 kg/m   Wt Readings from Last 3 Encounters:  10/12/16 214 lb 12.8 oz (97.4 kg)  08/05/16 215 lb 6.4 oz (97.7 kg)  01/19/16 215 lb (97.5 kg)    Physical Exam  Constitutional: He is oriented to person, place, and time. He appears well-developed and well-nourished. No distress.  HENT:  Head: Normocephalic and atraumatic.  Right Ear: Hearing normal.  Left Ear: Hearing normal.  Nose: Nose normal.  Eyes: Conjunctivae and lids are normal. Right eye exhibits no discharge. Left eye exhibits no discharge. No scleral icterus.  Cardiovascular: Normal rate, regular rhythm and normal heart sounds.     Pulmonary/Chest: Effort normal and breath sounds normal. No respiratory distress.  Abdominal: Soft. Bowel sounds are normal. He exhibits no distension and no mass. There is no tenderness. There is no rebound and no guarding.  Musculoskeletal: Normal range of motion.  Neurological: He is alert and oriented to person, place, and time.  Skin: Skin is intact. No rash noted.  Psychiatric: He has a normal mood and affect. His speech is normal and behavior is normal. Judgment and thought content normal. Cognition and memory are normal.    Results for orders placed or performed in visit on 08/05/16  CBC with Differential/Platelet  Result Value Ref Range   WBC 4.5 3.4 - 10.8 x10E3/uL   RBC 4.32 4.14 - 5.80 x10E6/uL   Hemoglobin 13.4 12.6 - 17.7 g/dL   Hematocrit 82.9 56.2 - 51.0 %   MCV 90 79 - 97 fL   MCH 31.0 26.6 - 33.0 pg   MCHC 34.5 31.5 - 35.7 g/dL   RDW 13.0 86.5 - 78.4 %   Platelets 283 150 - 379 x10E3/uL   Neutrophils 47 Not Estab. %   Lymphs 40 Not Estab. %   Monocytes 10 Not Estab. %   Eos 3 Not Estab. %   Basos 0 Not Estab. %   Neutrophils Absolute 2.1 1.4 - 7.0 x10E3/uL   Lymphocytes Absolute 1.8 0.7 - 3.1 x10E3/uL  Monocytes Absolute 0.5 0.1 - 0.9 x10E3/uL   EOS (ABSOLUTE) 0.1 0.0 - 0.4 x10E3/uL   Basophils Absolute 0.0 0.0 - 0.2 x10E3/uL   Immature Granulocytes 0 Not Estab. %   Immature Grans (Abs) 0.0 0.0 - 0.1 x10E3/uL  Comprehensive metabolic panel  Result Value Ref Range   Glucose 105 (H) 65 - 99 mg/dL   BUN 13 6 - 24 mg/dL   Creatinine, Ser 7.840.76 0.76 - 1.27 mg/dL   GFR calc non Af Amer 105 >59 mL/min/1.73   GFR calc Af Amer 121 >59 mL/min/1.73   BUN/Creatinine Ratio 17 9 - 20   Sodium 135 134 - 144 mmol/L   Potassium 4.4 3.5 - 5.2 mmol/L   Chloride 93 (L) 96 - 106 mmol/L   CO2 23 18 - 29 mmol/L   Calcium 10.0 8.7 - 10.2 mg/dL   Total Protein 7.3 6.0 - 8.5 g/dL   Albumin 4.8 3.5 - 5.5 g/dL   Globulin, Total 2.5 1.5 - 4.5 g/dL   Albumin/Globulin Ratio 1.9  1.2 - 2.2   Bilirubin Total 0.5 0.0 - 1.2 mg/dL   Alkaline Phosphatase 58 39 - 117 IU/L   AST 22 0 - 40 IU/L   ALT 23 0 - 44 IU/L  Lipid panel  Result Value Ref Range   Cholesterol, Total 312 (H) 100 - 199 mg/dL   Triglycerides 696620 (HH) 0 - 149 mg/dL   HDL 51 >29>39 mg/dL   VLDL Cholesterol Cal Comment 5 - 40 mg/dL   LDL Calculated Comment 0 - 99 mg/dL   Chol/HDL Ratio 6.1 (H) 0.0 - 5.0 ratio units  PSA  Result Value Ref Range   Prostate Specific Ag, Serum 1.3 0.0 - 4.0 ng/mL  TSH  Result Value Ref Range   TSH 2.100 0.450 - 4.500 uIU/mL  Urinalysis, Routine w reflex microscopic ([facility])  Result Value Ref Range   Specific Gravity, UA 1.005 1.005 - 1.030   pH, UA 6.5 5.0 - 7.5   Color, UA Yellow Yellow   Appearance Ur Clear Clear   Leukocytes, UA Negative Negative   Protein, UA Negative Negative/Trace   Glucose, UA Negative Negative   Ketones, UA Negative Negative   RBC, UA Negative Negative   Bilirubin, UA Negative Negative   Urobilinogen, Ur 0.2 0.2 - 1.0 mg/dL   Nitrite, UA Negative Negative      Assessment & Plan:   Problem List Items Addressed This Visit      Cardiovascular and Mediastinum   Hypertension    Discuss hypertension good control but may be side effects from Benzapril will stop Benzapril observe cough may take several weeks to taper down and stop if related to Benzapril. Will start hydrochlorothiazide 25 mg 1 a day.      Relevant Medications   hydrochlorothiazide (HYDRODIURIL) 25 MG tablet     Other   Hyperlipidemia - Primary    Patient's cholesterol down dramatically triglycerides patient nonfasting and elevated.      Relevant Medications   hydrochlorothiazide (HYDRODIURIL) 25 MG tablet   Other Relevant Orders   LP+ALT+AST Piccolo, Waived   Abdominal pain    Discuss nonspecific right upper quadrant abdominal pain may be related to greasy foods and concerned about possibility of gallbladder and gallstones. Will check abdominal ultrasound.        Relevant Orders   US Abdomen Limited RUQ       Follow up plan: Return in about 4 weeks (around 11/09/2016) for BMP,  Lipids, ALT,  AST Will check fasting.

## 2016-10-12 NOTE — Assessment & Plan Note (Signed)
Discuss nonspecific right upper quadrant abdominal pain may be related to greasy foods and concerned about possibility of gallbladder and gallstones. Will check abdominal ultrasound.

## 2016-10-12 NOTE — Assessment & Plan Note (Signed)
Discuss hypertension good control but may be side effects from Benzapril will stop Benzapril observe cough may take several weeks to taper down and stop if related to Benzapril. Will start hydrochlorothiazide 25 mg 1 a day.

## 2016-10-18 ENCOUNTER — Other Ambulatory Visit: Payer: Self-pay | Admitting: Family Medicine

## 2016-10-18 DIAGNOSIS — M1A00X0 Idiopathic chronic gout, unspecified site, without tophus (tophi): Secondary | ICD-10-CM

## 2016-10-27 ENCOUNTER — Ambulatory Visit
Admission: RE | Admit: 2016-10-27 | Discharge: 2016-10-27 | Disposition: A | Discharge status: Home / Self Care | Payer: Managed Care, Other (non HMO) | Source: Ambulatory Visit | Attending: Family Medicine | Admitting: Family Medicine

## 2016-10-27 ENCOUNTER — Telehealth: Payer: Self-pay | Admitting: Family Medicine

## 2016-10-27 DIAGNOSIS — R1011 Right upper quadrant pain: Secondary | ICD-10-CM | POA: Diagnosis not present

## 2016-10-27 NOTE — Telephone Encounter (Signed)
Patient notified

## 2016-10-27 NOTE — Telephone Encounter (Signed)
Please let him know that his ultrasound was negative and that Dr. Dossie Arbourrissman will call him to discuss next steps when he returns.

## 2016-11-02 ENCOUNTER — Other Ambulatory Visit: Payer: Self-pay

## 2016-11-11 ENCOUNTER — Ambulatory Visit: Payer: Managed Care, Other (non HMO) | Admitting: Family Medicine

## 2016-11-30 ENCOUNTER — Ambulatory Visit: Payer: Managed Care, Other (non HMO) | Admitting: Family Medicine

## 2016-12-15 ENCOUNTER — Encounter: Payer: Self-pay | Admitting: Family Medicine

## 2016-12-15 ENCOUNTER — Ambulatory Visit (INDEPENDENT_AMBULATORY_CARE_PROVIDER_SITE_OTHER): Payer: Managed Care, Other (non HMO) | Admitting: Family Medicine

## 2016-12-15 VITALS — BP 151/104 | HR 54 | Ht 72.0 in | Wt 217.0 lb

## 2016-12-15 DIAGNOSIS — I10 Essential (primary) hypertension: Secondary | ICD-10-CM

## 2016-12-15 DIAGNOSIS — E785 Hyperlipidemia, unspecified: Secondary | ICD-10-CM

## 2016-12-15 MED ORDER — AMLODIPINE BESYLATE 10 MG PO TABS: 10.0000 mg | ORAL_TABLET | Freq: Every day | ORAL | 3 refills | Status: DC

## 2016-12-15 NOTE — Assessment & Plan Note (Addendum)
Discuss triglycerides still greater than 500 patient fasting today will continue diet. Patient will add Fish oil up to 4 capsules a day. We will recheck again next office visit with lipid panel, ALT, AST.

## 2016-12-15 NOTE — Assessment & Plan Note (Signed)
Blood pressure poor control will continue current medications and increase amlodipine from 5 to10 mg recheck blood pressure 1 month or so.

## 2016-12-15 NOTE — Progress Notes (Signed)
BP (!) 151/104   Pulse (!) 54   Ht 6' (1.829 m)   Wt 217 lb (98.4 kg)   SpO2 99%   BMI 29.43 kg/m    Subjective:    Patient ID: David Barrera, male    DOB: 04-03-1966, 51 y.o.   MRN: [id number]  HPI: David Barrera is a 50 y.o. male  Chief Complaint  Patient presents with  . Follow-up  . Hyperlipidemia   Patient follow-up cough has gone away with stopping Benzapril. Taking hydrochlorothiazide without problems. Taking other blood pressure medicines without issues or problems either. Abdominal symptoms reviewed ultrasound which was normal of gallbladder with no stones. Patient still has some occasional right upper quadrant discomfort. Not bad enough to further pursue any further imaging or studies at this time. Reviewed blood work which was normal. If problems change will further evaluate. Cholesterol doing well with Crestor no issues Relevant past medical, surgical, family and social history reviewed and updated as indicated. Interim medical history since our last visit reviewed. Allergies and medications reviewed and updated.  Review of Systems  Constitutional: Negative.   Respiratory: Negative.   Cardiovascular: Negative.     Per HPI unless specifically indicated above     Objective:    BP (!) 151/104   Pulse (!) 54   Ht 6' (1.829 m)   Wt 217 lb (98.4 kg)   SpO2 99%   BMI 29.43 kg/m   Wt Readings from Last 3 Encounters:  12/15/16 217 lb (98.4 kg)  10/12/16 214 lb 12.8 oz (97.4 kg)  08/05/16 215 lb 6.4 oz (97.7 kg)    Physical Exam  Constitutional: He is oriented to person, place, and time. He appears well-developed and well-nourished. No distress.  HENT:  Head: Normocephalic and atraumatic.  Right Ear: Hearing normal.  Left Ear: Hearing normal.  Nose: Nose normal.  Eyes: Conjunctivae and lids are normal. Right eye exhibits no discharge. Left eye exhibits no discharge. No scleral icterus.  Cardiovascular: Normal rate, regular rhythm and normal heart sounds.    Pulmonary/Chest: Effort normal and breath sounds normal. No respiratory distress.  Musculoskeletal: Normal range of motion.  Neurological: He is alert and oriented to person, place, and time.  Skin: Skin is intact. No rash noted.  Psychiatric: He has a normal mood and affect. His speech is normal and behavior is normal. Judgment and thought content normal. Cognition and memory are normal.    Results for orders placed or performed in visit on 10/12/16  LP+ALT+AST Piccolo, [employer]  Result Value Ref Range   ALT (SGPT) Piccolo, Waived 31 10 - 47 U/L   AST (SGOT) Piccolo, Waived 36 11 - 38 U/L   Cholesterol Piccolo, Waived 224 (H) <200 mg/dL   HDL Chol Piccolo, Waived CANCELED    Triglycerides Piccolo,Waived >500 (H) <150 mg/dL      Assessment & Plan:   Problem List Items Addressed This Visit      Cardiovascular and Mediastinum   Hypertension - Primary    Blood pressure poor control will continue current medications and increase amlodipine from 5 to10 mg recheck blood pressure 1 month or so.      Relevant Medications   amLODipine (NORVASC) 10 MG tablet   Other Relevant Orders   Basic metabolic panel   LP+ALT+AST Piccolo, Waived     Other   Hyperlipidemia    Discuss triglycerides still greater than 500 patient fasting today will continue diet. Patient will add Fish oil up to 4 capsules  a day. We will recheck again next office visit with lipid panel, ALT, AST.       Relevant Medications   amLODipine (NORVASC) 10 MG tablet   Other Relevant Orders   Basic metabolic panel   LP+ALT+AST Piccolo, Waived       Follow up plan: Return in about 4 weeks (around 01/12/2017) for BMP,  Lipids, ALT, AST.

## 2016-12-16 LAB — BASIC METABOLIC PANEL
BUN/Creatinine Ratio: 21 — ABNORMAL HIGH (ref 9–20)
BUN: 15 mg/dL (ref 6–24)
CO2: 23 mmol/L (ref 18–29)
Calcium: 9.6 mg/dL (ref 8.7–10.2)
Chloride: 94 mmol/L — ABNORMAL LOW (ref 96–106)
Creatinine, Ser: 0.73 mg/dL — ABNORMAL LOW (ref 0.76–1.27)
GFR calc Af Amer: 123 mL/min/{1.73_m2} (ref >59)
GFR calc non Af Amer: 107 mL/min/{1.73_m2} (ref >59)
Glucose: 102 mg/dL — ABNORMAL HIGH (ref 65–99)
Potassium: 3.9 mmol/L (ref 3.5–5.2)
Sodium: 138 mmol/L (ref 134–144)

## 2016-12-17 LAB — LIPID PANEL W/O CHOL/HDL RATIO
Cholesterol, Total: 184 mg/dL (ref 100–199)
HDL: 46 mg/dL (ref >39)
Triglycerides: 572 mg/dL (ref 0–149)

## 2016-12-17 LAB — SPECIMEN STATUS REPORT

## 2017-01-20 ENCOUNTER — Ambulatory Visit: Payer: Managed Care, Other (non HMO) | Admitting: Family Medicine

## 2017-02-03 ENCOUNTER — Ambulatory Visit: Payer: Managed Care, Other (non HMO) | Admitting: Family Medicine

## 2017-02-14 ENCOUNTER — Ambulatory Visit (INDEPENDENT_AMBULATORY_CARE_PROVIDER_SITE_OTHER): Payer: Managed Care, Other (non HMO) | Admitting: Family Medicine

## 2017-02-14 ENCOUNTER — Encounter: Payer: Self-pay | Admitting: Family Medicine

## 2017-02-14 VITALS — BP 143/94 | HR 101 | Ht 72.0 in | Wt 218.6 lb

## 2017-02-14 DIAGNOSIS — M1A00X0 Idiopathic chronic gout, unspecified site, without tophus (tophi): Secondary | ICD-10-CM | POA: Diagnosis not present

## 2017-02-14 DIAGNOSIS — I10 Essential (primary) hypertension: Secondary | ICD-10-CM | POA: Diagnosis not present

## 2017-02-14 DIAGNOSIS — E785 Hyperlipidemia, unspecified: Secondary | ICD-10-CM | POA: Diagnosis not present

## 2017-02-14 LAB — LP+ALT+AST PICCOLO, WAIVED
ALT (SGPT) Piccolo, Waived: 37 U/L (ref 10–47)
AST (SGOT) Piccolo, Waived: 27 U/L (ref 11–38)
Chol/HDL Ratio Piccolo,Waive: 2.6 mg/dL
Cholesterol Piccolo, Waived: 188 mg/dL (ref <200)
HDL Chol Piccolo, Waived: 74 mg/dL (ref >59)
LDL Chol Calc Piccolo Waived: 51 mg/dL (ref <100)
Triglycerides Piccolo,Waived: 316 mg/dL — ABNORMAL HIGH (ref <150)
VLDL Chol Calc Piccolo,Waive: 63 mg/dL — ABNORMAL HIGH (ref <30)

## 2017-02-14 MED ORDER — METOPROLOL SUCCINATE ER 50 MG PO TB24
50.0000 mg | ORAL_TABLET | Freq: Every day | ORAL | 2 refills | Status: DC
Start: 2017-02-14 — End: 2017-03-23

## 2017-02-14 NOTE — Assessment & Plan Note (Signed)
Patient follow-up hypertension doing well with blood pressure medications which he takes faithfully. Will add metoprolol 50 mg for additional blood pressure control recheck in a month or so.

## 2017-02-14 NOTE — Assessment & Plan Note (Signed)
Good control of cholesterol with fish oil and Crestor. Patient will continue current medicines as no side effects discussed importance of treatment and prevention of pancreatitis. Patient will continue to affect control with weight loss diet exercise.

## 2017-02-14 NOTE — Addendum Note (Signed)
Addended byVonita Moss on: 02/14/2017 10:07 AM   Modules accepted: Orders

## 2017-02-14 NOTE — Assessment & Plan Note (Signed)
The current medical regimen is effective;  continue present plan and medications.  

## 2017-02-14 NOTE — Progress Notes (Signed)
BP (!) 143/94   Pulse (!) 101   Ht 6' (1.829 m)   Wt 218 lb 9.6 oz (99.2 kg)   SpO2 99%   BMI 29.65 kg/m    Subjective:    Patient ID: David Barrera, male    DOB: [birth year], 51 y.o.   MRN: [id number]  HPI: David Barrera is a 51 y.o. male  Chief Complaint  Patient presents with  . Follow-up  . Hypertension   hyperchol Patient all in all doing well status added Fish oils to his diet and tried to be healthier hasn't been able to add weight loss yet but that's coming. No issues with cholesterol or lipids medications. No gout symptoms. Patient's blood pressure has been more or less chronically elevated taking amlodipine and hydrochlorothiazide faithfully without problems. Still seems to have intermittently slightly bothersome right mid abdominal discomfort seems to come go intermittently not bad enough and is been ongoing intermittently for a year now with no real change in symptoms. Previous ultrasound was negative patient not ready for further evaluation yet though these options were discussed.  Relevant past medical, surgical, family and social history reviewed and updated as indicated. Interim medical history since our last visit reviewed. Allergies and medications reviewed and updated.  Review of Systems  Constitutional: Negative.   Respiratory: Negative.   Cardiovascular: Negative.     Per HPI unless specifically indicated above     Objective:    BP (!) 143/94   Pulse (!) 101   Ht 6' (1.829 m)   Wt 218 lb 9.6 oz (99.2 kg)   SpO2 99%   BMI 29.65 kg/m   Wt Readings from Last 3 Encounters:  02/14/17 218 lb 9.6 oz (99.2 kg)  12/15/16 217 lb (98.4 kg)  10/12/16 214 lb 12.8 oz (97.4 kg)    Physical Exam  Constitutional: He is oriented to person, place, and time. He appears well-developed and well-nourished.  HENT:  Head: Normocephalic and atraumatic.  Eyes: Conjunctivae and EOM are normal.  Neck: Normal range of motion.  Cardiovascular: Normal rate, regular  rhythm and normal heart sounds.   Pulmonary/Chest: Effort normal and breath sounds normal.  Musculoskeletal: Normal range of motion.  Neurological: He is alert and oriented to person, place, and time.  Skin: No erythema.  Psychiatric: He has a normal mood and affect. His behavior is normal. Judgment and thought content normal.    Results for orders placed or performed in visit on 12/15/16  Basic metabolic panel  Result Value Ref Range   Glucose 102 (H) 65 - 99 mg/dL   BUN 15 6 - 24 mg/dL   Creatinine, Ser 4.09 (L) 0.76 - 1.27 mg/dL   GFR calc non Af Amer 107 >59 mL/min/1.73   GFR calc Af Amer 123 >59 mL/min/1.73   BUN/Creatinine Ratio 21 (H) 9 - 20   Sodium 138 134 - 144 mmol/L   Potassium 3.9 3.5 - 5.2 mmol/L   Chloride 94 (L) 96 - 106 mmol/L   CO2 23 18 - 29 mmol/L   Calcium 9.6 8.7 - 10.2 mg/dL  Lipid Panel w/o Chol/HDL Ratio  Result Value Ref Range   Cholesterol, Total 184 100 - 199 mg/dL   Triglycerides 811 (HH) 0 - 149 mg/dL   HDL 46 >91 mg/dL   VLDL Cholesterol Cal Comment 5 - 40 mg/dL   LDL Calculated Comment 0 - 99 mg/dL  Specimen status report  Result Value Ref Range   specimen status report Comment  Assessment & Plan:   Problem List Items Addressed This Visit      Cardiovascular and Mediastinum   Hypertension - Primary    Patient follow-up hypertension doing well with blood pressure medications which he takes faithfully. Will add metoprolol 50 mg for additional blood pressure control recheck in a month or so.      Relevant Orders   Basic metabolic panel   LP+ALT+AST Piccolo, Waived     Other   Hyperlipidemia    Good control of cholesterol with fish oil and Crestor. Patient will continue current medicines as no side effects discussed importance of treatment and prevention of pancreatitis. Patient will continue to affect control with weight loss diet exercise.      Relevant Orders   Basic metabolic panel   LP+ALT+AST Piccolo, Waived   Gout    The  current medical regimen is effective;  continue present plan and medications.           Follow up plan: Return in about 4 weeks (around 03/14/2017) for Blood pressure check and medicine check.

## 2017-02-15 ENCOUNTER — Encounter: Payer: Self-pay | Admitting: Family Medicine

## 2017-02-15 LAB — BASIC METABOLIC PANEL
BUN/Creatinine Ratio: 20 (ref 9–20)
BUN: 16 mg/dL (ref 6–24)
CO2: 26 mmol/L (ref 18–29)
Calcium: 9.8 mg/dL (ref 8.7–10.2)
Chloride: 97 mmol/L (ref 96–106)
Creatinine, Ser: 0.81 mg/dL (ref 0.76–1.27)
GFR calc Af Amer: 118 mL/min/{1.73_m2} (ref >59)
GFR calc non Af Amer: 102 mL/min/{1.73_m2} (ref >59)
Glucose: 132 mg/dL — ABNORMAL HIGH (ref 65–99)
Potassium: 4 mmol/L (ref 3.5–5.2)
Sodium: 141 mmol/L (ref 134–144)

## 2017-03-01 ENCOUNTER — Telehealth: Payer: Self-pay | Admitting: Family Medicine

## 2017-03-01 NOTE — Telephone Encounter (Signed)
Call pt 

## 2017-03-01 NOTE — Telephone Encounter (Signed)
Phone call Discussed with patient been taking amlodipine 10 mg long-term with no leg edema increase metoprolol from 25-50 developed leg edema stopped metoprolol leg edema is gone away. Patient will retry metoprolol 50 again observe for leg edema if that develops will call and leave a message and will make proper adjustments.

## 2017-03-01 NOTE — Telephone Encounter (Signed)
Please see message from pt. He had d/c metoprolol due to leg edema.

## 2017-03-23 ENCOUNTER — Encounter: Payer: Self-pay | Admitting: Family Medicine

## 2017-03-23 ENCOUNTER — Ambulatory Visit (INDEPENDENT_AMBULATORY_CARE_PROVIDER_SITE_OTHER): Payer: Managed Care, Other (non HMO) | Admitting: Family Medicine

## 2017-03-23 VITALS — BP 127/92 | HR 93 | Temp 97.8°F | Ht 72.0 in | Wt 218.6 lb

## 2017-03-23 DIAGNOSIS — I10 Essential (primary) hypertension: Secondary | ICD-10-CM

## 2017-03-23 DIAGNOSIS — R7309 Other abnormal glucose: Secondary | ICD-10-CM

## 2017-03-23 DIAGNOSIS — Z79899 Other long term (current) drug therapy: Secondary | ICD-10-CM

## 2017-03-23 LAB — BAYER DCA HB A1C WAIVED: HB A1C (BAYER DCA - WAIVED): 5.5 % (ref <7.0)

## 2017-03-23 MED ORDER — METOPROLOL SUCCINATE ER 100 MG PO TB24: 100.0000 mg | ORAL_TABLET | Freq: Every day | ORAL | 2 refills | Status: DC

## 2017-03-23 MED ORDER — AMLODIPINE BESYLATE 5 MG PO TABS: 5.0000 mg | ORAL_TABLET | Freq: Every day | ORAL | 2 refills | Status: DC

## 2017-03-23 NOTE — Assessment & Plan Note (Signed)
Reviewed blood pressure elevated with checking here and side effects to most likely amlodipine with foot edema and some  burning type sensation. We will decrease amlodipine from 10 mg to 5 mg observe edema and burning sensation. Will increase metoprolol from 50-100 mg for better blood pressure control and observe pulse rate.

## 2017-03-23 NOTE — Progress Notes (Signed)
BP (!) 127/92   Pulse 93   Temp 97.8 F (36.6 C)   Ht 6' (1.829 m)   Wt 218 lb 9.6 oz (99.2 kg)   SpO2 99%   BMI 29.65 kg/m    Subjective:    Patient ID: David Barrera, male    DOB: October 05, 1966, 51 y.o.   MRN: [id number]  HPI: David Barrera is a 51 y.o. male  Chief Complaint  Patient presents with  . Hypertension  . Edema    Patient complains of feet swelling  Patient was some confusion about medication and changes as noted in telephone note. Not sure if changes were executed. Feet continue to swell at the end of the day and blood pressure elevated somewhat. Patient also concerned with some nonspecific numbness burning type sensation in the bottom of his forefoot and toes. This is been ongoing the last month or so is concerned about his blood sugar. On chart review his last nonfasting glucose was slightly elevated.  Relevant past medical, surgical, family and social history reviewed and updated as indicated. Interim medical history since our last visit reviewed. Allergies and medications reviewed and updated.  Review of Systems  Constitutional: Negative.   Respiratory: Negative.   Cardiovascular: Negative.     Per HPI unless specifically indicated above     Objective:    BP (!) 127/92   Pulse 93   Temp 97.8 F (36.6 C)   Ht 6' (1.829 m)   Wt 218 lb 9.6 oz (99.2 kg)   SpO2 99%   BMI 29.65 kg/m   Wt Readings from Last 3 Encounters:  03/23/17 218 lb 9.6 oz (99.2 kg)  02/14/17 218 lb 9.6 oz (99.2 kg)  12/15/16 217 lb (98.4 kg)    Physical Exam  Constitutional: He is oriented to person, place, and time. He appears well-developed and well-nourished.  HENT:  Head: Normocephalic and atraumatic.  Eyes: Conjunctivae and EOM are normal.  Neck: Normal range of motion.  Cardiovascular: Normal rate, regular rhythm and normal heart sounds.   Pulmonary/Chest: Effort normal and breath sounds normal.  Musculoskeletal: Normal range of motion.  Neurological: He is alert  and oriented to person, place, and time.  Monofilament testing in both feet completely normal with normal pulses and sensation.  Skin: No erythema.  Psychiatric: He has a normal mood and affect. His behavior is normal. Judgment and thought content normal.    Results for orders placed or performed in visit on 02/14/17  Basic metabolic panel  Result Value Ref Range   Glucose 132 (H) 65 - 99 mg/dL   BUN 16 6 - 24 mg/dL   Creatinine, Ser 4.09 0.76 - 1.27 mg/dL   GFR calc non Af Amer 102 >59 mL/min/1.73   GFR calc Af Amer 118 >59 mL/min/1.73   BUN/Creatinine Ratio 20 9 - 20   Sodium 141 134 - 144 mmol/L   Potassium 4.0 3.5 - 5.2 mmol/L   Chloride 97 96 - 106 mmol/L   CO2 26 18 - 29 mmol/L   Calcium 9.8 8.7 - 10.2 mg/dL  LP+ALT+AST Piccolo, Waived  Result Value Ref Range   ALT (SGPT) Piccolo, Waived 37 10 - 47 U/L   AST (SGOT) Piccolo, Waived 27 11 - 38 U/L   Cholesterol Piccolo, Waived 188 <200 mg/dL   HDL Chol Piccolo, Waived 74 >59 mg/dL   Triglycerides Piccolo,Waived 316 (H) <150 mg/dL   Chol/HDL Ratio Piccolo,Waive 2.6 mg/dL   LDL Chol Calc Piccolo Waived 51 <  100 mg/dL   VLDL Chol Calc Piccolo,Waive 63 (H) <30 mg/dL      Assessment & Plan:   Problem List Items Addressed This Visit      Cardiovascular and Mediastinum   Hypertension - Primary    Reviewed blood pressure elevated with checking here and side effects to most likely amlodipine with foot edema and some  burning type sensation. We will decrease amlodipine from 10 mg to 5 mg observe edema and burning sensation. Will increase metoprolol from 50-100 mg for better blood pressure control and observe pulse rate.      Relevant Medications   amLODipine (NORVASC) 5 MG tablet   metoprolol succinate (TOPROL-XL) 100 MG 24 hr tablet    Other Visit Diagnoses    Medication management       Elevated glucose       Relevant Orders   Bayer DCA Hb A1c Waived       Follow up plan: Return in about 4 weeks (around 04/20/2017),  or if symptoms worsen or fail to improve, for BMP,  Lipids, ALT, AST.

## 2017-04-27 ENCOUNTER — Ambulatory Visit: Payer: Managed Care, Other (non HMO) | Admitting: Family Medicine

## 2017-05-03 ENCOUNTER — Encounter: Payer: Self-pay | Admitting: Family Medicine

## 2017-05-03 ENCOUNTER — Ambulatory Visit (INDEPENDENT_AMBULATORY_CARE_PROVIDER_SITE_OTHER): Payer: Managed Care, Other (non HMO) | Admitting: Family Medicine

## 2017-05-03 VITALS — BP 136/86 | HR 77 | Wt 218.0 lb

## 2017-05-03 DIAGNOSIS — E785 Hyperlipidemia, unspecified: Secondary | ICD-10-CM

## 2017-05-03 DIAGNOSIS — I10 Essential (primary) hypertension: Secondary | ICD-10-CM

## 2017-05-03 DIAGNOSIS — R1011 Right upper quadrant pain: Secondary | ICD-10-CM

## 2017-05-03 MED ORDER — FENOFIBRIC ACID 135 MG PO CPDR: 135.0000 mg | DELAYED_RELEASE_CAPSULE | Freq: Every morning | ORAL | 4 refills | Status: DC

## 2017-05-03 NOTE — Progress Notes (Signed)
   BP 136/86 (BP Location: Left Arm)   Pulse 77   Wt 218 lb (98.9 kg)   SpO2 99%   BMI 29.57 kg/m    Subjective:    Patient ID: David Barrera, male    DOB: 01/03/1966, 51 y.o.   MRN: [id number]  HPI: David EarlsDonald W Huether is a 51 y.o. male  Chief Complaint  Patient presents with  . Follow-up  . Hypertension  . Flank Pain    Right.   Patient follow-up hypertension decrease amlodipine from 10 mg decreased to 5 mg. Patient's edema and numbness in his toes when away. Metoprolol was increased from 50-100 mg. Patient's doing well with these medications no issues home blood pressure checking showing good control and no side effects are noticed cardiac rate or issues. Reviewed right flank pain ultrasound of liver normal. Has had similar symptoms for 2 years no real change in symptoms or issues. For now wants to leave alone. Triglycerides trying to do better with diet and reduction of triglyceride rich foods.  Relevant past medical, surgical, family and social history reviewed and updated as indicated. Interim medical history since our last visit reviewed. Allergies and medications reviewed and updated.  Review of Systems  Constitutional: Negative.   Respiratory: Negative.   Cardiovascular: Negative.     Per HPI unless specifically indicated above     Objective:    BP 136/86 (BP Location: Left Arm)   Pulse 77   Wt 218 lb (98.9 kg)   SpO2 99%   BMI 29.57 kg/m   Wt Readings from Last 3 Encounters:  05/03/17 218 lb (98.9 kg)  03/23/17 218 lb 9.6 oz (99.2 kg)  02/14/17 218 lb 9.6 oz (99.2 kg)    Physical Exam  Constitutional: He is oriented to person, place, and time. He appears well-developed and well-nourished.  HENT:  Head: Normocephalic and atraumatic.  Eyes: Conjunctivae and EOM are normal.  Neck: Normal range of motion.  Cardiovascular: Normal rate, regular rhythm and normal heart sounds.   Pulmonary/Chest: Effort normal and breath sounds normal.  Musculoskeletal: Normal  range of motion.  Neurological: He is alert and oriented to person, place, and time.  Skin: No erythema.  Psychiatric: He has a normal mood and affect. His behavior is normal. Judgment and thought content normal.    Results for orders placed or performed in visit on 03/23/17  Bayer DCA Hb A1c Waived  Result Value Ref Range   Bayer DCA Hb A1c Waived 5.5 <7.0 %      Assessment & Plan:   Problem List Items Addressed This Visit      Cardiovascular and Mediastinum   Hypertension - Primary    The current medical regimen is effective;  continue present plan and medications.       Relevant Medications   Choline Fenofibrate (FENOFIBRIC ACID) 135 MG CPDR   Other Relevant Orders   Basic metabolic panel   LP+ALT+AST Piccolo, Waived     Other   Hyperlipidemia   Relevant Medications   Choline Fenofibrate (FENOFIBRIC ACID) 135 MG CPDR   Other Relevant Orders   Basic metabolic panel   LP+ALT+AST Piccolo, Waived   Abdominal pain    Will continue to observe abdominal pain issues if becomes prominent or changes will pursue further workup.          Follow up plan: Return for Physical Exam Oct.

## 2017-05-03 NOTE — Assessment & Plan Note (Signed)
Will continue to observe abdominal pain issues if becomes prominent or changes will pursue further workup.

## 2017-05-03 NOTE — Assessment & Plan Note (Signed)
The current medical regimen is effective;  continue present plan and medications.  

## 2017-05-04 LAB — SPECIMEN STATUS REPORT

## 2017-05-05 LAB — BASIC METABOLIC PANEL
BUN/Creatinine Ratio: 21 — ABNORMAL HIGH (ref 9–20)
BUN: 16 mg/dL (ref 6–24)
CO2: 25 mmol/L (ref 20–29)
Calcium: 9.5 mg/dL (ref 8.7–10.2)
Chloride: 96 mmol/L (ref 96–106)
Creatinine, Ser: 0.75 mg/dL — ABNORMAL LOW (ref 0.76–1.27)
GFR calc Af Amer: 122 mL/min/{1.73_m2} (ref >59)
GFR calc non Af Amer: 105 mL/min/{1.73_m2} (ref >59)
Glucose: 100 mg/dL — ABNORMAL HIGH (ref 65–99)
Potassium: 4 mmol/L (ref 3.5–5.2)
Sodium: 139 mmol/L (ref 134–144)

## 2017-05-06 LAB — SPECIMEN STATUS REPORT

## 2017-05-06 LAB — LIPID PANEL W/O CHOL/HDL RATIO
Cholesterol, Total: 210 mg/dL — ABNORMAL HIGH (ref 100–199)
HDL: 43 mg/dL (ref >39)
Triglycerides: 753 mg/dL (ref 0–149)

## 2017-05-09 ENCOUNTER — Encounter: Payer: Self-pay | Admitting: Family Medicine

## 2017-08-10 ENCOUNTER — Encounter: Payer: Self-pay | Admitting: Family Medicine

## 2017-08-10 ENCOUNTER — Ambulatory Visit (INDEPENDENT_AMBULATORY_CARE_PROVIDER_SITE_OTHER): Payer: Managed Care, Other (non HMO) | Admitting: Family Medicine

## 2017-08-10 VITALS — BP 165/100 | HR 95 | Ht 72.0 in | Wt 212.0 lb

## 2017-08-10 DIAGNOSIS — K219 Gastro-esophageal reflux disease without esophagitis: Secondary | ICD-10-CM

## 2017-08-10 DIAGNOSIS — M1A00X0 Idiopathic chronic gout, unspecified site, without tophus (tophi): Secondary | ICD-10-CM

## 2017-08-10 DIAGNOSIS — Z0000 Encounter for general adult medical examination without abnormal findings: Secondary | ICD-10-CM

## 2017-08-10 DIAGNOSIS — I10 Essential (primary) hypertension: Secondary | ICD-10-CM

## 2017-08-10 DIAGNOSIS — Z23 Encounter for immunization: Secondary | ICD-10-CM

## 2017-08-10 DIAGNOSIS — E785 Hyperlipidemia, unspecified: Secondary | ICD-10-CM

## 2017-08-10 LAB — URINALYSIS, ROUTINE W REFLEX MICROSCOPIC
Bilirubin, UA: NEGATIVE
Glucose, UA: NEGATIVE
Ketones, UA: NEGATIVE
Leukocytes, UA: NEGATIVE
Nitrite, UA: NEGATIVE
Protein, UA: NEGATIVE
RBC, UA: NEGATIVE
Specific Gravity, UA: 1.015 (ref 1.005–1.030)
Urobilinogen, Ur: 0.2 mg/dL (ref 0.2–1.0)
pH, UA: 8 — ABNORMAL HIGH (ref 5.0–7.5)

## 2017-08-10 LAB — MICROSCOPIC EXAMINATION

## 2017-08-10 MED ORDER — ROSUVASTATIN CALCIUM 20 MG PO TABS: 20.0000 mg | ORAL_TABLET | Freq: Every day | ORAL | 4 refills | Status: DC

## 2017-08-10 MED ORDER — AMLODIPINE BESYLATE 5 MG PO TABS: 5.0000 mg | ORAL_TABLET | Freq: Every day | ORAL | 4 refills | Status: DC

## 2017-08-10 MED ORDER — OMEPRAZOLE 20 MG PO CPDR: 20.0000 mg | DELAYED_RELEASE_CAPSULE | Freq: Every day | ORAL | 4 refills | Status: DC

## 2017-08-10 MED ORDER — ALLOPURINOL 100 MG PO TABS: 100.0000 mg | ORAL_TABLET | Freq: Every day | ORAL | 4 refills | Status: DC

## 2017-08-10 MED ORDER — HYDROCHLOROTHIAZIDE 25 MG PO TABS: 25.0000 mg | ORAL_TABLET | Freq: Every day | ORAL | 4 refills | Status: DC

## 2017-08-10 MED ORDER — METOPROLOL SUCCINATE ER 100 MG PO TB24: 100.0000 mg | ORAL_TABLET | Freq: Every day | ORAL | 4 refills | Status: DC

## 2017-08-10 MED ORDER — FENOFIBRIC ACID 135 MG PO CPDR: 135.0000 mg | DELAYED_RELEASE_CAPSULE | Freq: Every morning | ORAL | 4 refills | Status: DC

## 2017-08-10 NOTE — Assessment & Plan Note (Signed)
The current medical regimen is effective;  continue present plan and medications.  

## 2017-08-10 NOTE — Progress Notes (Signed)
BP (!) 165/100   Pulse 95   Ht 6' (1.829 m)   Wt 212 lb (96.2 kg)   SpO2 98%   BMI 28.75 kg/m    Subjective:    Patient ID: David Barrera, male    DOB: [birth year], 51 y.o.   MRN: [id number]  HPI: David Barrera is a 51 y.o. male  Chief Complaint  Patient presents with  . Annual Exam   Patient all in all doing well taking medications without problems. Did not take blood pressure medicines are any medicines today due to fasting. As a consequence blood pressure is up. Routinely blood pressure checked elsewhere has been good. No complaints or issues with medications which he takes faithfully. Cholesterol doing well triglycerides doing well with finger fever Acid. Reflux also stable with Prilosec. No gout signs or symptoms except allopurinol 100 mg a day.  Relevant past medical, surgical, family and social history reviewed and updated as indicated. Interim medical history since our last visit reviewed. Allergies and medications reviewed and updated.  Review of Systems  Constitutional: Negative.   HENT: Negative.   Eyes: Negative.   Respiratory: Negative.   Cardiovascular: Negative.   Gastrointestinal: Negative.   Endocrine: Negative.   Genitourinary: Negative.   Musculoskeletal: Negative.   Skin: Negative.   Allergic/Immunologic: Negative.   Neurological: Negative.   Hematological: Negative.   Psychiatric/Behavioral: Negative.     Per HPI unless specifically indicated above     Objective:    BP (!) 165/100   Pulse 95   Ht 6' (1.829 m)   Wt 212 lb (96.2 kg)   SpO2 98%   BMI 28.75 kg/m   Wt Readings from Last 3 Encounters:  08/10/17 212 lb (96.2 kg)  05/03/17 218 lb (98.9 kg)  03/23/17 218 lb 9.6 oz (99.2 kg)    Physical Exam  Constitutional: He is oriented to person, place, and time. He appears well-developed and well-nourished.  HENT:  Head: Normocephalic and atraumatic.  Right Ear: External ear normal.  Left Ear: External ear normal.  Eyes: Pupils are  equal, round, and reactive to light. Conjunctivae and EOM are normal.  Neck: Normal range of motion. Neck supple.  Cardiovascular: Normal rate, regular rhythm, normal heart sounds and intact distal pulses.   Pulmonary/Chest: Effort normal and breath sounds normal.  Abdominal: Soft. Bowel sounds are normal. There is no splenomegaly or hepatomegaly.  Genitourinary: Rectum normal, prostate normal and penis normal.  Musculoskeletal: Normal range of motion.  Neurological: He is alert and oriented to person, place, and time. He has normal reflexes.  Skin: No rash noted. No erythema.  Psychiatric: He has a normal mood and affect. His behavior is normal. Judgment and thought content normal.    Results for orders placed or performed in visit on 05/03/17  Basic metabolic panel  Result Value Ref Range   Glucose 100 (H) 65 - 99 mg/dL   BUN 16 6 - 24 mg/dL   Creatinine, Ser 7.82 (L) 0.76 - 1.27 mg/dL   GFR calc non Af Amer 105 >59 mL/min/1.73   GFR calc Af Amer 122 >59 mL/min/1.73   BUN/Creatinine Ratio 21 (H) 9 - 20   Sodium 139 134 - 144 mmol/L   Potassium 4.0 3.5 - 5.2 mmol/L   Chloride 96 96 - 106 mmol/L   CO2 25 20 - 29 mmol/L   Calcium 9.5 8.7 - 10.2 mg/dL  Specimen status report  Result Value Ref Range   specimen status report Comment  Lipid Panel w/o Chol/HDL Ratio  Result Value Ref Range   Cholesterol, Total 210 (H) 100 - 199 mg/dL   Triglycerides 960 (HH) 0 - 149 mg/dL   HDL 43 >45 mg/dL   VLDL Cholesterol Cal Comment 5 - 40 mg/dL   LDL Calculated Comment 0 - 99 mg/dL  Specimen status report  Result Value Ref Range   specimen status report Comment       Assessment & Plan:   Problem List Items Addressed This Visit      Cardiovascular and Mediastinum   Hypertension - Primary    The current medical regimen is effective;  continue present plan and medications.       Relevant Medications   amLODipine (NORVASC) 5 MG tablet   Choline Fenofibrate (FENOFIBRIC ACID) 135 MG  CPDR   hydrochlorothiazide (HYDRODIURIL) 25 MG tablet   metoprolol succinate (TOPROL-XL) 100 MG 24 hr tablet   rosuvastatin (CRESTOR) 20 MG tablet   Other Relevant Orders   CBC with Differential/Platelet   Comprehensive metabolic panel   Flu Vaccine QUAD 36+ mos IM     Digestive   Acid reflux   Relevant Medications   omeprazole (PRILOSEC) 20 MG capsule     Other   Hyperlipidemia    The current medical regimen is effective;  continue present plan and medications.       Relevant Medications   amLODipine (NORVASC) 5 MG tablet   Choline Fenofibrate (FENOFIBRIC ACID) 135 MG CPDR   hydrochlorothiazide (HYDRODIURIL) 25 MG tablet   metoprolol succinate (TOPROL-XL) 100 MG 24 hr tablet   rosuvastatin (CRESTOR) 20 MG tablet   Other Relevant Orders   Lipid panel   Gout    The current medical regimen is effective;  continue present plan and medications.       Relevant Medications   allopurinol (ZYLOPRIM) 100 MG tablet   Other Relevant Orders   Uric acid    Other Visit Diagnoses    Needs flu shot       PE (physical exam), annual       Relevant Orders   CBC with Differential/Platelet   Comprehensive metabolic panel   Lipid panel   PSA   TSH   Urinalysis, Routine w reflex microscopic   Flu Vaccine QUAD 36+ mos IM       Follow up plan: Return in about 6 months (around 02/08/2018) for BMP,  Lipids, ALT, AST.

## 2017-08-11 ENCOUNTER — Encounter: Payer: Self-pay | Admitting: Family Medicine

## 2017-08-11 LAB — CBC WITH DIFFERENTIAL/PLATELET
Basophils Absolute: 0 10*3/uL (ref 0.0–0.2)
Basos: 1 %
EOS (ABSOLUTE): 0.1 10*3/uL (ref 0.0–0.4)
Eos: 2 %
Hematocrit: 38.7 % (ref 37.5–51.0)
Hemoglobin: 13.1 g/dL (ref 13.0–17.7)
Immature Grans (Abs): 0 10*3/uL (ref 0.0–0.1)
Immature Granulocytes: 0 %
Lymphocytes Absolute: 1.9 10*3/uL (ref 0.7–3.1)
Lymphs: 45 %
MCH: 30.3 pg (ref 26.6–33.0)
MCHC: 33.9 g/dL (ref 31.5–35.7)
MCV: 89 fL (ref 79–97)
Monocytes Absolute: 0.3 10*3/uL (ref 0.1–0.9)
Monocytes: 8 %
Neutrophils Absolute: 1.9 10*3/uL (ref 1.4–7.0)
Neutrophils: 44 %
Platelets: 276 10*3/uL (ref 150–379)
RBC: 4.33 x10E6/uL (ref 4.14–5.80)
RDW: 13 % (ref 12.3–15.4)
WBC: 4.3 10*3/uL (ref 3.4–10.8)

## 2017-08-11 LAB — LIPID PANEL
Chol/HDL Ratio: 2.8 ratio (ref 0.0–5.0)
Cholesterol, Total: 162 mg/dL (ref 100–199)
HDL: 58 mg/dL (ref >39)
LDL Calculated: 61 mg/dL (ref 0–99)
Triglycerides: 215 mg/dL — ABNORMAL HIGH (ref 0–149)
VLDL Cholesterol Cal: 43 mg/dL — ABNORMAL HIGH (ref 5–40)

## 2017-08-11 LAB — COMPREHENSIVE METABOLIC PANEL
ALT: 23 IU/L (ref 0–44)
AST: 29 IU/L (ref 0–40)
Albumin/Globulin Ratio: 2 (ref 1.2–2.2)
Albumin: 5 g/dL (ref 3.5–5.5)
Alkaline Phosphatase: 36 IU/L — ABNORMAL LOW (ref 39–117)
BUN/Creatinine Ratio: 24 — ABNORMAL HIGH (ref 9–20)
BUN: 21 mg/dL (ref 6–24)
Bilirubin Total: 0.4 mg/dL (ref 0.0–1.2)
CO2: 23 mmol/L (ref 20–29)
Calcium: 9.5 mg/dL (ref 8.7–10.2)
Chloride: 98 mmol/L (ref 96–106)
Creatinine, Ser: 0.86 mg/dL (ref 0.76–1.27)
GFR calc Af Amer: 114 mL/min/{1.73_m2} (ref >59)
GFR calc non Af Amer: 99 mL/min/{1.73_m2} (ref >59)
Globulin, Total: 2.5 g/dL (ref 1.5–4.5)
Glucose: 98 mg/dL (ref 65–99)
Potassium: 3.7 mmol/L (ref 3.5–5.2)
Sodium: 138 mmol/L (ref 134–144)
Total Protein: 7.5 g/dL (ref 6.0–8.5)

## 2017-08-11 LAB — PSA: Prostate Specific Ag, Serum: 1 ng/mL (ref 0.0–4.0)

## 2017-08-11 LAB — URIC ACID: Uric Acid: 4 mg/dL (ref 3.7–8.6)

## 2017-08-11 LAB — TSH: TSH: 1.93 u[IU]/mL (ref 0.450–4.500)

## 2017-10-30 ENCOUNTER — Other Ambulatory Visit: Payer: Self-pay | Admitting: Family Medicine

## 2017-11-03 ENCOUNTER — Other Ambulatory Visit: Payer: Self-pay | Admitting: Family Medicine

## 2017-11-03 ENCOUNTER — Telehealth: Payer: Self-pay | Admitting: Family Medicine

## 2017-11-03 NOTE — Telephone Encounter (Unsigned)
Copied from CRM [id number]. Topic: Quick Communication - See Telephone Encounter >> Nov 03, 2017 12:22 PM Cipriano BunkerLambe, Annette S wrote: CRM for notification. See Telephone encounter for:  Choline Fenofibrate 135 mg Oral  Every morning - 10a  needed.  He is out and this was submitted on 12/30 from pharmacy  [facility] [id number] [address]- [place], [place] - [address] [place] [id number] Phone: [phone] Fax: [phone]   11/03/17.

## 2017-12-09 ENCOUNTER — Other Ambulatory Visit: Payer: Self-pay | Admitting: Family Medicine

## 2017-12-09 DIAGNOSIS — I10 Essential (primary) hypertension: Secondary | ICD-10-CM

## 2018-02-01 DIAGNOSIS — H6123 Impacted cerumen, bilateral: Secondary | ICD-10-CM | POA: Diagnosis not present

## 2018-02-01 DIAGNOSIS — M79672 Pain in left foot: Secondary | ICD-10-CM | POA: Diagnosis not present

## 2018-02-08 ENCOUNTER — Ambulatory Visit: Payer: Managed Care, Other (non HMO) | Admitting: Family Medicine

## 2018-03-06 ENCOUNTER — Encounter: Payer: Self-pay | Admitting: Family Medicine

## 2018-03-06 ENCOUNTER — Ambulatory Visit: Payer: BLUE CROSS/BLUE SHIELD | Admitting: Family Medicine

## 2018-03-06 VITALS — BP 130/84 | HR 69 | Ht 72.0 in | Wt 210.0 lb

## 2018-03-06 DIAGNOSIS — I10 Essential (primary) hypertension: Secondary | ICD-10-CM | POA: Diagnosis not present

## 2018-03-06 DIAGNOSIS — Z114 Encounter for screening for human immunodeficiency virus [HIV]: Secondary | ICD-10-CM

## 2018-03-06 DIAGNOSIS — E782 Mixed hyperlipidemia: Secondary | ICD-10-CM

## 2018-03-06 LAB — LP+ALT+AST PICCOLO, WAIVED
ALT (SGPT) Piccolo, Waived: 33 U/L (ref 10–47)
AST (SGOT) Piccolo, Waived: 45 U/L — ABNORMAL HIGH (ref 11–38)
Chol/HDL Ratio Piccolo,Waive: 2.7 mg/dL
Cholesterol Piccolo, Waived: 179 mg/dL (ref <200)
HDL Chol Piccolo, Waived: 66 mg/dL (ref >59)
LDL Chol Calc Piccolo Waived: 59 mg/dL (ref <100)
Triglycerides Piccolo,Waived: 269 mg/dL — ABNORMAL HIGH (ref <150)
VLDL Chol Calc Piccolo,Waive: 54 mg/dL — ABNORMAL HIGH (ref <30)

## 2018-03-06 NOTE — Progress Notes (Signed)
BP 130/84 (BP Location: Left Arm)   Pulse 69   Ht 6' (1.829 m)   Wt 210 lb (95.3 kg)   SpO2 98%   BMI 28.48 kg/m    Subjective:    Patient ID: David Barrera, male    DOB: 1965-11-21, 52 y.o.   MRN: [id number]  HPI: David Barrera is a 52 y.o. male  Chief Complaint  Patient presents with  . Follow-up  Follow-up hypertension hypercholesterol.  Blood pressure doing well with medications no side effects or issues. Follow-up cholesterol doing well no complaints from medications taken faithfully without problems. Patient with also bilateral shoulder and elbow medial elbow discomfort.  Pretty much all day takes an occasional Advil or Aleve.  Sometimes Tylenol which seems to be doing okay.  On exam no specific area of tenderness other than medial elbows and anterior shoulders.  More consistent with arthritis changes. Relevant past medical, surgical, family and social history reviewed and updated as indicated. Interim medical history since our last visit reviewed. Allergies and medications reviewed and updated.  Review of Systems  Constitutional: Negative.   Respiratory: Negative.   Cardiovascular: Negative.     Per HPI unless specifically indicated above     Objective:    BP 130/84 (BP Location: Left Arm)   Pulse 69   Ht 6' (1.829 m)   Wt 210 lb (95.3 kg)   SpO2 98%   BMI 28.48 kg/m   Wt Readings from Last 3 Encounters:  03/06/18 210 lb (95.3 kg)  08/10/17 212 lb (96.2 kg)  05/03/17 218 lb (98.9 kg)    Physical Exam  Constitutional: He is oriented to person, place, and time. He appears well-developed and well-nourished.  HENT:  Head: Normocephalic and atraumatic.  Eyes: Conjunctivae and EOM are normal.  Neck: Normal range of motion.  Cardiovascular: Normal rate, regular rhythm and normal heart sounds.  Pulmonary/Chest: Effort normal and breath sounds normal.  Musculoskeletal: Normal range of motion.  Neurological: He is alert and oriented to person, place, and time.    Skin: No erythema.  Psychiatric: He has a normal mood and affect. His behavior is normal. Judgment and thought content normal.    Results for orders placed or performed in visit on 08/10/17  Microscopic Examination  Result Value Ref Range   WBC, UA 0-5 0 - 5 /hpf   RBC, UA 0-2 0 - 2 /hpf   Epithelial Cells (non renal) 0-10 0 - 10 /hpf   Renal Epithel, UA 0-10 (A) None seen /hpf   Bacteria, UA Few None seen/Few  CBC with Differential/Platelet  Result Value Ref Range   WBC 4.3 3.4 - 10.8 x10E3/uL   RBC 4.33 4.14 - 5.80 x10E6/uL   Hemoglobin 13.1 13.0 - 17.7 g/dL   Hematocrit 40.9 81.1 - 51.0 %   MCV 89 79 - 97 fL   MCH 30.3 26.6 - 33.0 pg   MCHC 33.9 31.5 - 35.7 g/dL   RDW 91.4 78.2 - 95.6 %   Platelets 276 150 - 379 x10E3/uL   Neutrophils 44 Not Estab. %   Lymphs 45 Not Estab. %   Monocytes 8 Not Estab. %   Eos 2 Not Estab. %   Basos 1 Not Estab. %   Neutrophils Absolute 1.9 1.4 - 7.0 x10E3/uL   Lymphocytes Absolute 1.9 0.7 - 3.1 x10E3/uL   Monocytes Absolute 0.3 0.1 - 0.9 x10E3/uL   EOS (ABSOLUTE) 0.1 0.0 - 0.4 x10E3/uL   Basophils Absolute 0.0 0.0 -  0.2 x10E3/uL   Immature Granulocytes 0 Not Estab. %   Immature Grans (Abs) 0.0 0.0 - 0.1 x10E3/uL  Comprehensive metabolic panel  Result Value Ref Range   Glucose 98 65 - 99 mg/dL   BUN 21 6 - 24 mg/dL   Creatinine, Ser 1.61 0.76 - 1.27 mg/dL   GFR calc non Af Amer 99 >59 mL/min/1.73   GFR calc Af Amer 114 >59 mL/min/1.73   BUN/Creatinine Ratio 24 (H) 9 - 20   Sodium 138 134 - 144 mmol/L   Potassium 3.7 3.5 - 5.2 mmol/L   Chloride 98 96 - 106 mmol/L   CO2 23 20 - 29 mmol/L   Calcium 9.5 8.7 - 10.2 mg/dL   Total Protein 7.5 6.0 - 8.5 g/dL   Albumin 5.0 3.5 - 5.5 g/dL   Globulin, Total 2.5 1.5 - 4.5 g/dL   Albumin/Globulin Ratio 2.0 1.2 - 2.2   Bilirubin Total 0.4 0.0 - 1.2 mg/dL   Alkaline Phosphatase 36 (L) 39 - 117 IU/L   AST 29 0 - 40 IU/L   ALT 23 0 - 44 IU/L  Lipid panel  Result Value Ref Range    Cholesterol, Total 162 100 - 199 mg/dL   Triglycerides 096 (H) 0 - 149 mg/dL   HDL 58 >04 mg/dL   VLDL Cholesterol Cal 43 (H) 5 - 40 mg/dL   LDL Calculated 61 0 - 99 mg/dL   Chol/HDL Ratio 2.8 0.0 - 5.0 ratio  PSA  Result Value Ref Range   Prostate Specific Ag, Serum 1.0 0.0 - 4.0 ng/mL  TSH  Result Value Ref Range   TSH 1.930 0.450 - 4.500 uIU/mL  Urinalysis, Routine w reflex microscopic  Result Value Ref Range   Specific Gravity, UA 1.015 1.005 - 1.030   pH, UA 8.0 (H) 5.0 - 7.5   Color, UA Yellow Yellow   Appearance Ur Turbid (A) Clear   Leukocytes, UA Negative Negative   Protein, UA Negative Negative/Trace   Glucose, UA Negative Negative   Ketones, UA Negative Negative   RBC, UA Negative Negative   Bilirubin, UA Negative Negative   Urobilinogen, Ur 0.2 0.2 - 1.0 mg/dL   Nitrite, UA Negative Negative   Microscopic Examination See below:   Uric acid  Result Value Ref Range   Uric Acid 4.0 3.7 - 8.6 mg/dL      Assessment & Plan:   Problem List Items Addressed This Visit      Cardiovascular and Mediastinum   Hypertension - Primary    The current medical regimen is effective;  continue present plan and medications.       Relevant Orders   Basic metabolic panel   LP+ALT+AST Piccolo, Waived     Other   Hyperlipidemia    The current medical regimen is effective;  continue present plan and medications.       Relevant Orders   Basic metabolic panel   LP+ALT+AST Piccolo, Waived    Other Visit Diagnoses    Encounter for screening for HIV       Relevant Orders   HIV antibody (with reflex)       Follow up plan: Return in about 6 months (around 09/06/2018) for Physical Exam.

## 2018-03-06 NOTE — Assessment & Plan Note (Signed)
The current medical regimen is effective;  continue present plan and medications.  

## 2018-03-07 ENCOUNTER — Other Ambulatory Visit: Payer: Self-pay | Admitting: Family Medicine

## 2018-03-07 ENCOUNTER — Encounter: Payer: Self-pay | Admitting: Family Medicine

## 2018-03-07 DIAGNOSIS — I10 Essential (primary) hypertension: Secondary | ICD-10-CM

## 2018-03-07 LAB — BASIC METABOLIC PANEL
BUN/Creatinine Ratio: 26 — ABNORMAL HIGH (ref 9–20)
BUN: 24 mg/dL (ref 6–24)
CO2: 24 mmol/L (ref 20–29)
Calcium: 9.3 mg/dL (ref 8.7–10.2)
Chloride: 97 mmol/L (ref 96–106)
Creatinine, Ser: 0.93 mg/dL (ref 0.76–1.27)
GFR calc Af Amer: 108 mL/min/{1.73_m2} (ref >59)
GFR calc non Af Amer: 93 mL/min/{1.73_m2} (ref >59)
Glucose: 89 mg/dL (ref 65–99)
Potassium: 4 mmol/L (ref 3.5–5.2)
Sodium: 139 mmol/L (ref 134–144)

## 2018-03-07 LAB — HIV ANTIBODY (ROUTINE TESTING W REFLEX): HIV Screen 4th Generation wRfx: NONREACTIVE

## 2018-05-31 ENCOUNTER — Other Ambulatory Visit: Payer: Self-pay | Admitting: Family Medicine

## 2018-05-31 NOTE — Telephone Encounter (Signed)
fenofibric acid 135 mg refill Last Refill:11/03/17 # 30 caps 3 RF Last OV: 03/06/18 PCP: Dr Dossie Arbourrissman [facility] [address]

## 2018-06-15 ENCOUNTER — Other Ambulatory Visit: Payer: Self-pay | Admitting: Family Medicine

## 2018-06-15 DIAGNOSIS — I10 Essential (primary) hypertension: Secondary | ICD-10-CM

## 2018-06-24 ENCOUNTER — Other Ambulatory Visit: Payer: Self-pay | Admitting: Family Medicine

## 2018-06-26 NOTE — Telephone Encounter (Signed)
Refill of Fenofibric acid  LOV 03/06/18 Dr. Dossie Arbourrissman  LRF 05/31/18  #30  2 refills        [id number] [place], [place] - [address]

## 2018-08-18 ENCOUNTER — Encounter: Payer: Self-pay | Admitting: Podiatry

## 2018-08-18 ENCOUNTER — Ambulatory Visit: Payer: Self-pay | Admitting: Podiatry

## 2018-08-18 ENCOUNTER — Ambulatory Visit (INDEPENDENT_AMBULATORY_CARE_PROVIDER_SITE_OTHER): Payer: BLUE CROSS/BLUE SHIELD

## 2018-08-18 ENCOUNTER — Ambulatory Visit (INDEPENDENT_AMBULATORY_CARE_PROVIDER_SITE_OTHER): Payer: BLUE CROSS/BLUE SHIELD | Admitting: Podiatry

## 2018-08-18 ENCOUNTER — Other Ambulatory Visit: Payer: Self-pay | Admitting: Podiatry

## 2018-08-18 DIAGNOSIS — M722 Plantar fascial fibromatosis: Secondary | ICD-10-CM

## 2018-08-18 DIAGNOSIS — G5792 Unspecified mononeuropathy of left lower limb: Secondary | ICD-10-CM | POA: Diagnosis not present

## 2018-08-18 DIAGNOSIS — M779 Enthesopathy, unspecified: Secondary | ICD-10-CM

## 2018-08-18 DIAGNOSIS — G5790 Unspecified mononeuropathy of unspecified lower limb: Secondary | ICD-10-CM | POA: Diagnosis not present

## 2018-08-18 MED ORDER — METHYLPREDNISOLONE 4 MG PO TBPK: ORAL_TABLET | ORAL | 0 refills | Status: DC

## 2018-08-18 NOTE — Progress Notes (Signed)
   Subjective: 52 year old male presenting today as a new patient with a chief complaint of intermittent numbness, aching, burning and tingling of the bilateral heels and plantar forefoot that began one year ago. He states the left foot is worse than the right. Standing after being seated for a long period of time and walking increases the pain. He has been taking Ibuprofen with some relief. Patient is here for further evaluation and treatment.   Past Medical History:  Diagnosis Date  . Allergy   . Hyperlipidemia   . Hypertension      Objective: Physical Exam General: The patient is alert and oriented x3 in no acute distress.  Dermatology: Skin is warm, dry and supple bilateral lower extremities. Negative for open lesions or macerations bilateral.   Vascular: Dorsalis Pedis and Posterior Tibial pulses palpable bilateral.  Capillary fill time is immediate to all digits.  Neurological: Epicritic and protective threshold intact bilateral.   Musculoskeletal: Tenderness to palpation to the plantar aspect of the right heel along the plantar fascia. All other joints range of motion within normal limits bilateral. Strength 5/5 in all groups bilateral.   Radiographic exam: Normal osseous mineralization. Joint spaces preserved. No fracture/dislocation/boney destruction. No other soft tissue abnormalities or radiopaque foreign bodies.   Assessment: 1. Plantar fasciitis right 2. Neuritis bilateral feet   Plan of Care:  1. Patient evaluated. Xrays reviewed.   2. Prescription for Medrol Dose Pak provided to patient.  3. Appointment with [facility] for custom orthotics.  4. Return to clinic as needed.   Runs a sawmill.    Felecia Shelling, DPM [facility]  Dr. Felecia Shelling, DPM    [address] [place], [place] [address]                Office [phone]  Fax [phone]

## 2018-08-28 ENCOUNTER — Other Ambulatory Visit: Payer: Self-pay | Admitting: Family Medicine

## 2018-08-28 DIAGNOSIS — K219 Gastro-esophageal reflux disease without esophagitis: Secondary | ICD-10-CM

## 2018-08-28 DIAGNOSIS — E785 Hyperlipidemia, unspecified: Secondary | ICD-10-CM

## 2018-08-28 NOTE — Telephone Encounter (Signed)
Requested medication (s) are due for refill today: Yes  Requested medication (s) are on the active medication list: Yes  Last refill:  08/10/17 for both medications  Future visit scheduled: Yes  Notes to clinic:  Expired Rx's, unable to refill     Requested Prescriptions  Pending Prescriptions Disp Refills   omeprazole (PRILOSEC) 20 MG capsule [Pharmacy Med Name: OMEPRAZOLE DR 20 MG CAPSULE] 90 capsule 2    Sig: TAKE 1 CAPSULE BY MOUTH EVERY DAY     Gastroenterology: Proton Pump Inhibitors Passed - 08/28/2018  1:36 AM      Passed - Valid encounter within last 12 months    Recent Outpatient Visits          5 months ago Essential hypertension   [facility] Crissman, Redge Gainer, MD   1 year ago Essential hypertension   [facility] Crissman, Redge Gainer, MD   1 year ago Essential hypertension   [facility] Crissman, Redge Gainer, MD   1 year ago Essential hypertension   [facility] Crissman, Redge Gainer, MD   1 year ago Essential hypertension   [facility] Crissman, Redge Gainer, MD      Future Appointments            In 4 weeks Crissman, Redge Gainer, MD [facility]          rosuvastatin (CRESTOR) 20 MG tablet [Pharmacy Med Name: ROSUVASTATIN CALCIUM 20 MG TAB] 90 tablet 2    Sig: TAKE 1 TABLET BY MOUTH EVERY DAY     Cardiovascular:  Antilipid - Statins Failed - 08/28/2018  1:36 AM      Failed - Triglycerides in normal range and within 360 days    Triglycerides Piccolo,Waived  Date Value Ref Range Status  03/06/2018 269 (H) <150 mg/dL Final    Comment:                            Normal                   <150                         Borderline High     150 - 199                         High                200 - 499                         Very High                >499          Passed - Total Cholesterol in normal range and within 360 days    Cholesterol Piccolo, Waived  Date Value Ref Range Status  03/06/2018  179 <200 mg/dL Final    Comment:                            Desirable                <200                         Borderline High  200- 239                         High                     >239          Passed - LDL in normal range and within 360 days    LDL Calculated  Date Value Ref Range Status  08/10/2017 61 0 - 99 mg/dL Final         Passed - HDL in normal range and within 360 days    HDL  Date Value Ref Range Status  08/10/2017 58 >39 mg/dL Final    Comment:    **Effective August 22, 2017, HDL Cholesterol**   reference interval will be changing to:                                   Male        Male                               [phone]   27 - [id number]          Passed - Patient is not pregnant      Passed - Valid encounter within last 12 months    Recent Outpatient Visits          5 months ago Essential hypertension   [facility] Crissman, Redge Gainer, MD   1 year ago Essential hypertension   [facility] Crissman, Redge Gainer, MD   1 year ago Essential hypertension   [facility] Crissman, Redge Gainer, MD   1 year ago Essential hypertension   [facility] Crissman, Redge Gainer, MD   1 year ago Essential hypertension   [facility] Crissman, Redge Gainer, MD      Future Appointments            In 4 weeks Crissman, Redge Gainer, MD [facility]

## 2018-08-30 ENCOUNTER — Ambulatory Visit (INDEPENDENT_AMBULATORY_CARE_PROVIDER_SITE_OTHER): Payer: BLUE CROSS/BLUE SHIELD | Admitting: Orthotics

## 2018-08-30 DIAGNOSIS — G5790 Unspecified mononeuropathy of unspecified lower limb: Secondary | ICD-10-CM

## 2018-08-30 DIAGNOSIS — M722 Plantar fascial fibromatosis: Secondary | ICD-10-CM

## 2018-08-30 NOTE — Progress Notes (Signed)
Patient came into today for casting bilateral f/o to address plantar fasciitis as well as neurits   Patient reports history of foot pain involving plantar aponeurosis.  Goal is to provide longitudinal arch support and correct any RF instability due to heel eversion/inversion.  Ultimate goal is to relieve tension at pf insertion calcaneal tuberosity.  Plan on semi-rigid device addressing heel stability and relieving PF tension.

## 2018-09-16 ENCOUNTER — Other Ambulatory Visit: Payer: Self-pay | Admitting: Family Medicine

## 2018-09-16 DIAGNOSIS — I10 Essential (primary) hypertension: Secondary | ICD-10-CM

## 2018-09-18 DIAGNOSIS — C4442 Squamous cell carcinoma of skin of scalp and neck: Secondary | ICD-10-CM | POA: Diagnosis not present

## 2018-09-18 DIAGNOSIS — X32XXXA Exposure to sunlight, initial encounter: Secondary | ICD-10-CM | POA: Diagnosis not present

## 2018-09-18 DIAGNOSIS — D485 Neoplasm of uncertain behavior of skin: Secondary | ICD-10-CM | POA: Diagnosis not present

## 2018-09-18 DIAGNOSIS — R208 Other disturbances of skin sensation: Secondary | ICD-10-CM | POA: Diagnosis not present

## 2018-09-18 DIAGNOSIS — D225 Melanocytic nevi of trunk: Secondary | ICD-10-CM | POA: Diagnosis not present

## 2018-09-18 DIAGNOSIS — D2261 Melanocytic nevi of right upper limb, including shoulder: Secondary | ICD-10-CM | POA: Diagnosis not present

## 2018-09-18 DIAGNOSIS — D2262 Melanocytic nevi of left upper limb, including shoulder: Secondary | ICD-10-CM | POA: Diagnosis not present

## 2018-09-18 DIAGNOSIS — L821 Other seborrheic keratosis: Secondary | ICD-10-CM | POA: Diagnosis not present

## 2018-09-18 DIAGNOSIS — L570 Actinic keratosis: Secondary | ICD-10-CM | POA: Diagnosis not present

## 2018-09-20 ENCOUNTER — Ambulatory Visit (INDEPENDENT_AMBULATORY_CARE_PROVIDER_SITE_OTHER): Payer: BLUE CROSS/BLUE SHIELD | Admitting: Orthotics

## 2018-09-20 ENCOUNTER — Other Ambulatory Visit: Payer: BLUE CROSS/BLUE SHIELD | Admitting: Orthotics

## 2018-09-20 DIAGNOSIS — M722 Plantar fascial fibromatosis: Secondary | ICD-10-CM

## 2018-09-20 DIAGNOSIS — G5790 Unspecified mononeuropathy of unspecified lower limb: Secondary | ICD-10-CM

## 2018-09-20 NOTE — Progress Notes (Signed)
Patient came in today to pick up custom made foot orthotics.  The goals were accomplished and the patient reported no dissatisfaction with said orthotics.  Patient was advised of breakin period and how to report any issues. 

## 2018-09-23 ENCOUNTER — Other Ambulatory Visit: Payer: Self-pay | Admitting: Family Medicine

## 2018-09-24 NOTE — Telephone Encounter (Signed)
Appointment tomorrow. Requested Prescriptions  Pending Prescriptions Disp Refills  . Choline Fenofibrate (FENOFIBRIC ACID) 135 MG CPDR [Pharmacy Med Name: FENOFIBRIC ACID DR 135 MG CAP] 90 capsule 0    Sig: TAKE 1 CAPSULE BY MOUTH EVERY DAY IN THE MORNING     Cardiovascular:  Antilipid - Fibric Acid Derivatives Failed - 09/23/2018  2:10 AM      Failed - Triglycerides in normal range and within 360 days    Triglycerides Piccolo,Waived  Date Value Ref Range Status  03/06/2018 269 (H) <150 mg/dL Final    Comment:                            Normal                   <150                         Borderline High     150 - 199                         High                200 - 499                         Very High                >499          Failed - ALT in normal range and within 180 days    ALT (SGPT) Piccolo, Waived  Date Value Ref Range Status  03/06/2018 33 10 - 47 U/L Final         Failed - AST in normal range and within 180 days    AST (SGOT) Piccolo, Waived  Date Value Ref Range Status  03/06/2018 45 (H) 11 - 38 U/L Final         Failed - Cr in normal range and within 180 days    Creatinine, Ser  Date Value Ref Range Status  03/06/2018 0.93 0.76 - 1.27 mg/dL Final         Failed - eGFR in normal range and within 180 days    GFR calc Af Amer  Date Value Ref Range Status  03/06/2018 108 >59 mL/min/1.73 Final   GFR calc non Af Amer  Date Value Ref Range Status  03/06/2018 93 >59 mL/min/1.73 Final         Passed - Total Cholesterol in normal range and within 360 days    Cholesterol Piccolo, Waived  Date Value Ref Range Status  03/06/2018 179 <200 mg/dL Final    Comment:                            Desirable                <200                         Borderline High      200- 239                         High                     >239  Passed - LDL in normal range and within 360 days    LDL Calculated  Date Value Ref Range Status  08/10/2017 61 0 - 99  mg/dL Final         Passed - HDL in normal range and within 360 days    HDL  Date Value Ref Range Status  08/10/2017 58 >39 mg/dL Final    Comment:    **Effective August 22, 2017, HDL Cholesterol**   reference interval will be changing to:                                   Male        Male                               57 [phone]   72 - 999999          Passed - Valid encounter within last 12 months    Recent Outpatient Visits          6 months ago Essential hypertension   [place] [facility], Jeannette How, MD   1 year ago Essential hypertension   [place], Jeannette How, MD   1 year ago Essential hypertension   [place], Jeannette How, MD   1 year ago Essential hypertension   [facility] Crissman, Jeannette How, MD   1 year ago Essential hypertension   [place], Jeannette How, MD      Future Appointments            Tomorrow Crissman, Jeannette How, MD [facility], [place]

## 2018-09-25 ENCOUNTER — Encounter: Payer: BLUE CROSS/BLUE SHIELD | Admitting: Family Medicine

## 2018-09-25 ENCOUNTER — Encounter: Payer: Self-pay | Admitting: Family Medicine

## 2018-09-25 ENCOUNTER — Ambulatory Visit (INDEPENDENT_AMBULATORY_CARE_PROVIDER_SITE_OTHER): Payer: BLUE CROSS/BLUE SHIELD | Admitting: Family Medicine

## 2018-09-25 VITALS — BP 132/82 | HR 69 | Temp 98.1°F | Ht 70.5 in | Wt 216.5 lb

## 2018-09-25 DIAGNOSIS — Z23 Encounter for immunization: Secondary | ICD-10-CM | POA: Diagnosis not present

## 2018-09-25 DIAGNOSIS — I10 Essential (primary) hypertension: Secondary | ICD-10-CM

## 2018-09-25 DIAGNOSIS — E782 Mixed hyperlipidemia: Secondary | ICD-10-CM | POA: Diagnosis not present

## 2018-09-25 DIAGNOSIS — Z1329 Encounter for screening for other suspected endocrine disorder: Secondary | ICD-10-CM

## 2018-09-25 DIAGNOSIS — Z0000 Encounter for general adult medical examination without abnormal findings: Secondary | ICD-10-CM

## 2018-09-25 DIAGNOSIS — Z125 Encounter for screening for malignant neoplasm of prostate: Secondary | ICD-10-CM

## 2018-09-25 DIAGNOSIS — E785 Hyperlipidemia, unspecified: Secondary | ICD-10-CM | POA: Diagnosis not present

## 2018-09-25 DIAGNOSIS — M1A00X0 Idiopathic chronic gout, unspecified site, without tophus (tophi): Secondary | ICD-10-CM

## 2018-09-25 DIAGNOSIS — K219 Gastro-esophageal reflux disease without esophagitis: Secondary | ICD-10-CM

## 2018-09-25 DIAGNOSIS — R7309 Other abnormal glucose: Secondary | ICD-10-CM

## 2018-09-25 LAB — URINALYSIS, ROUTINE W REFLEX MICROSCOPIC
Bilirubin, UA: NEGATIVE
Glucose, UA: NEGATIVE
Ketones, UA: NEGATIVE
Leukocytes, UA: NEGATIVE
Nitrite, UA: NEGATIVE
Protein, UA: NEGATIVE
RBC, UA: NEGATIVE
Specific Gravity, UA: 1.015 (ref 1.005–1.030)
Urobilinogen, Ur: 0.2 mg/dL (ref 0.2–1.0)
pH, UA: 7 (ref 5.0–7.5)

## 2018-09-25 MED ORDER — METOPROLOL SUCCINATE ER 100 MG PO TB24: 100.0000 mg | ORAL_TABLET | Freq: Every day | ORAL | 4 refills | Status: DC

## 2018-09-25 MED ORDER — OMEPRAZOLE 20 MG PO CPDR: 20.0000 mg | DELAYED_RELEASE_CAPSULE | Freq: Every day | ORAL | 4 refills | Status: DC

## 2018-09-25 MED ORDER — ROSUVASTATIN CALCIUM 20 MG PO TABS: 20.0000 mg | ORAL_TABLET | Freq: Every day | ORAL | 4 refills | Status: DC

## 2018-09-25 MED ORDER — AMLODIPINE BESYLATE 5 MG PO TABS: 5.0000 mg | ORAL_TABLET | Freq: Every day | ORAL | 4 refills | Status: DC

## 2018-09-25 MED ORDER — HYDROCHLOROTHIAZIDE 25 MG PO TABS: 25.0000 mg | ORAL_TABLET | Freq: Every day | ORAL | 4 refills | Status: DC

## 2018-09-25 MED ORDER — ALLOPURINOL 100 MG PO TABS: 100.0000 mg | ORAL_TABLET | Freq: Every day | ORAL | 4 refills | Status: DC

## 2018-09-25 NOTE — Progress Notes (Signed)
BP 132/82 (BP Location: Left Arm)   Pulse 69   Temp 98.1 F (36.7 C) (Oral)   Ht 5' 10.5" (1.791 m)   Wt 216 lb 8 oz (98.2 kg)   SpO2 98%   BMI 30.63 kg/m    Subjective:    Patient ID: David Barrera, male    DOB: 1966/01/04, 52 y.o.   MRN: [id number]  HPI: David Barrera is a 52 y.o. male  Chief Complaint  Patient presents with  . Annual Exam  Patient all in all doing well no complaints no gout spells or issues taking medications faithfully without problems for blood pressure cholesterol and triglycerides without problems.   Relevant past medical, surgical, family and social history reviewed and updated as indicated. Interim medical history since our last visit reviewed. Allergies and medications reviewed and updated.  Review of Systems  Constitutional: Negative.   HENT: Negative.   Eyes: Negative.   Respiratory: Negative.   Cardiovascular: Negative.   Gastrointestinal: Negative.   Endocrine: Negative.   Genitourinary: Negative.   Musculoskeletal: Negative.   Skin: Negative.   Allergic/Immunologic: Negative.   Neurological: Negative.   Hematological: Negative.   Psychiatric/Behavioral: Negative.     Per HPI unless specifically indicated above     Objective:    BP 132/82 (BP Location: Left Arm)   Pulse 69   Temp 98.1 F (36.7 C) (Oral)   Ht 5' 10.5" (1.791 m)   Wt 216 lb 8 oz (98.2 kg)   SpO2 98%   BMI 30.63 kg/m   Wt Readings from Last 3 Encounters:  09/25/18 216 lb 8 oz (98.2 kg)  03/06/18 210 lb (95.3 kg)  08/10/17 212 lb (96.2 kg)    Physical Exam  Constitutional: He is oriented to person, place, and time. He appears well-developed and well-nourished.  HENT:  Head: Normocephalic and atraumatic.  Right Ear: External ear normal.  Left Ear: External ear normal.  Eyes: Pupils are equal, round, and reactive to light. Conjunctivae and EOM are normal.  Neck: Normal range of motion. Neck supple.  Cardiovascular: Normal rate, regular rhythm, normal  heart sounds and intact distal pulses.  Pulmonary/Chest: Effort normal and breath sounds normal.  Abdominal: Soft. Bowel sounds are normal. There is no splenomegaly or hepatomegaly.  Genitourinary: Rectum normal, prostate normal and penis normal.  Musculoskeletal: Normal range of motion.  Neurological: He is alert and oriented to person, place, and time. He has normal reflexes.  Skin: No rash noted. No erythema.  Psychiatric: He has a normal mood and affect. His behavior is normal. Judgment and thought content normal.    Results for orders placed or performed in visit on 03/06/18  Basic metabolic panel  Result Value Ref Range   Glucose 89 65 - 99 mg/dL   BUN 24 6 - 24 mg/dL   Creatinine, Ser 4.09 0.76 - 1.27 mg/dL   GFR calc non Af Amer 93 >59 mL/min/1.73   GFR calc Af Amer 108 >59 mL/min/1.73   BUN/Creatinine Ratio 26 (H) 9 - 20   Sodium 139 134 - 144 mmol/L   Potassium 4.0 3.5 - 5.2 mmol/L   Chloride 97 96 - 106 mmol/L   CO2 24 20 - 29 mmol/L   Calcium 9.3 8.7 - 10.2 mg/dL  LP+ALT+AST Piccolo, Waived  Result Value Ref Range   ALT (SGPT) Piccolo, Waived 33 10 - 47 U/L   AST (SGOT) Piccolo, Waived 45 (H) 11 - 38 U/L   Cholesterol Piccolo, Waived 179 <200  mg/dL   HDL Chol Piccolo, Waived 66 >59 mg/dL   Triglycerides Piccolo,Waived 269 (H) <150 mg/dL   Chol/HDL Ratio Piccolo,Waive 2.7 mg/dL   LDL Chol Calc Piccolo Waived 59 <100 mg/dL   VLDL Chol Calc Piccolo,Waive 54 (H) <30 mg/dL  HIV antibody (with reflex)  Result Value Ref Range   HIV Screen 4th Generation wRfx Non Reactive Non Reactive      Assessment & Plan:   Problem List Items Addressed This Visit      Cardiovascular and Mediastinum   Hypertension - Primary    The current medical regimen is effective;  continue present plan and medications.       Relevant Medications   amLODipine (NORVASC) 5 MG tablet   hydrochlorothiazide (HYDRODIURIL) 25 MG tablet   metoprolol succinate (TOPROL-XL) 100 MG 24 hr tablet    rosuvastatin (CRESTOR) 20 MG tablet   Other Relevant Orders   CBC with Differential/Platelet   Comprehensive metabolic panel   Lipid panel   Urinalysis, Routine w reflex microscopic     Digestive   Acid reflux    The current medical regimen is effective;  continue present plan and medications.       Relevant Medications   omeprazole (PRILOSEC) 20 MG capsule     Other   Hyperlipidemia    The current medical regimen is effective;  continue present plan and medications.       Relevant Medications   amLODipine (NORVASC) 5 MG tablet   hydrochlorothiazide (HYDRODIURIL) 25 MG tablet   metoprolol succinate (TOPROL-XL) 100 MG 24 hr tablet   rosuvastatin (CRESTOR) 20 MG tablet   Other Relevant Orders   CBC with Differential/Platelet   Comprehensive metabolic panel   Lipid panel   Urinalysis, Routine w reflex microscopic   CBC with Differential/Platelet   Comprehensive metabolic panel   Lipid panel   Urinalysis, Routine w reflex microscopic   Gout    The current medical regimen is effective;  continue present plan and medications.       Relevant Medications   allopurinol (ZYLOPRIM) 100 MG tablet   Other Relevant Orders   Uric acid    Other Visit Diagnoses    Needs flu shot       Relevant Orders   Flu Vaccine QUAD 36+ mos IM (Completed)   PE (physical exam), annual       Relevant Orders   CBC with Differential/Platelet   Comprehensive metabolic panel   Lipid panel   PSA   TSH   Urinalysis, Routine w reflex microscopic   Flu Vaccine QUAD 36+ mos IM (Completed)   Elevated glucose       Relevant Orders   CBC with Differential/Platelet   Comprehensive metabolic panel   Lipid panel   Urinalysis, Routine w reflex microscopic   Thyroid disorder screen       Relevant Orders   TSH   Prostate cancer screening       Relevant Orders   PSA       Follow up plan: Return in about 6 months (around 03/26/2019) for BMP,  Lipids, ALT, AST.

## 2018-09-25 NOTE — Assessment & Plan Note (Signed)
The current medical regimen is effective;  continue present plan and medications.  

## 2018-09-25 NOTE — Patient Instructions (Signed)

## 2018-09-26 ENCOUNTER — Encounter: Payer: Self-pay | Admitting: Family Medicine

## 2018-09-26 LAB — COMPREHENSIVE METABOLIC PANEL
ALT: 22 IU/L (ref 0–44)
AST: 23 IU/L (ref 0–40)
Albumin/Globulin Ratio: 2.5 — ABNORMAL HIGH (ref 1.2–2.2)
Albumin: 5 g/dL (ref 3.5–5.5)
Alkaline Phosphatase: 41 IU/L (ref 39–117)
BUN/Creatinine Ratio: 17 (ref 9–20)
BUN: 16 mg/dL (ref 6–24)
Bilirubin Total: <0.2 mg/dL (ref 0.0–1.2)
CO2: 23 mmol/L (ref 20–29)
Calcium: 9.8 mg/dL (ref 8.7–10.2)
Chloride: 95 mmol/L — ABNORMAL LOW (ref 96–106)
Creatinine, Ser: 0.96 mg/dL (ref 0.76–1.27)
GFR calc Af Amer: 103 mL/min/{1.73_m2} (ref >59)
GFR calc non Af Amer: 89 mL/min/{1.73_m2} (ref >59)
Globulin, Total: 2 g/dL (ref 1.5–4.5)
Glucose: 106 mg/dL — ABNORMAL HIGH (ref 65–99)
Potassium: 3.8 mmol/L (ref 3.5–5.2)
Sodium: 138 mmol/L (ref 134–144)
Total Protein: 7 g/dL (ref 6.0–8.5)

## 2018-09-26 LAB — CBC WITH DIFFERENTIAL/PLATELET
Basophils Absolute: 0.1 10*3/uL (ref 0.0–0.2)
Basos: 1 %
EOS (ABSOLUTE): 0.1 10*3/uL (ref 0.0–0.4)
Eos: 3 %
Hematocrit: 38.4 % (ref 37.5–51.0)
Hemoglobin: 13.2 g/dL (ref 13.0–17.7)
Immature Grans (Abs): 0 10*3/uL (ref 0.0–0.1)
Immature Granulocytes: 1 %
Lymphocytes Absolute: 1.9 10*3/uL (ref 0.7–3.1)
Lymphs: 46 %
MCH: 31.1 pg (ref 26.6–33.0)
MCHC: 34.4 g/dL (ref 31.5–35.7)
MCV: 90 fL (ref 79–97)
Monocytes Absolute: 0.4 10*3/uL (ref 0.1–0.9)
Monocytes: 9 %
Neutrophils Absolute: 1.6 10*3/uL (ref 1.4–7.0)
Neutrophils: 40 %
Platelets: 352 10*3/uL (ref 150–450)
RBC: 4.25 x10E6/uL (ref 4.14–5.80)
RDW: 12.9 % (ref 12.3–15.4)
WBC: 4.1 10*3/uL (ref 3.4–10.8)

## 2018-09-26 LAB — LIPID PANEL
Chol/HDL Ratio: 3.7 ratio (ref 0.0–5.0)
Cholesterol, Total: 183 mg/dL (ref 100–199)
HDL: 50 mg/dL (ref >39)
LDL Calculated: 82 mg/dL (ref 0–99)
Triglycerides: 255 mg/dL — ABNORMAL HIGH (ref 0–149)
VLDL Cholesterol Cal: 51 mg/dL — ABNORMAL HIGH (ref 5–40)

## 2018-09-26 LAB — URIC ACID: Uric Acid: 5.1 mg/dL (ref 3.7–8.6)

## 2018-09-26 LAB — PSA: Prostate Specific Ag, Serum: 1 ng/mL (ref 0.0–4.0)

## 2018-09-26 LAB — TSH: TSH: 2.01 u[IU]/mL (ref 0.450–4.500)

## 2018-10-20 IMAGING — US US ABDOMEN LIMITED
1 series · 14 of 25 positions shown · non-contrast
Comparison: None.

CLINICAL DATA: Right upper quadrant pain worse greasy meals for 6
months.

EXAM:
US ABDOMEN LIMITED - RIGHT UPPER QUADRANT

[Series 1: us abdomen limited · 0.25mm/px · 14 of 72 slices shown]
[im 1/72]
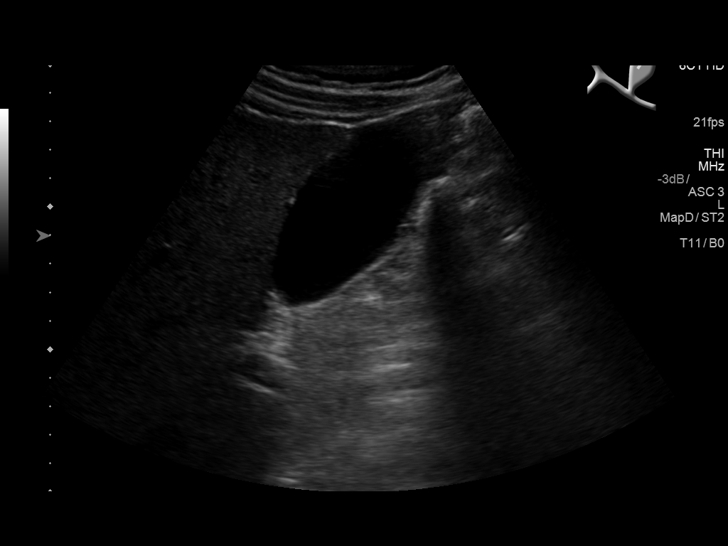
[im 6/72]
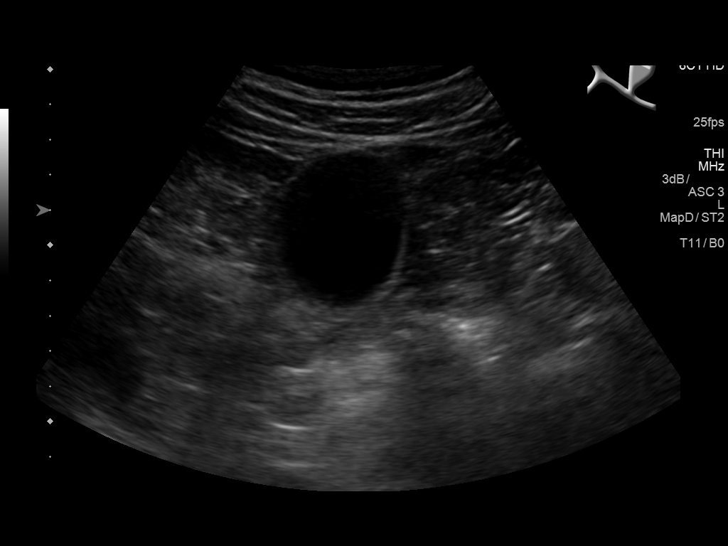
[im 12/72]
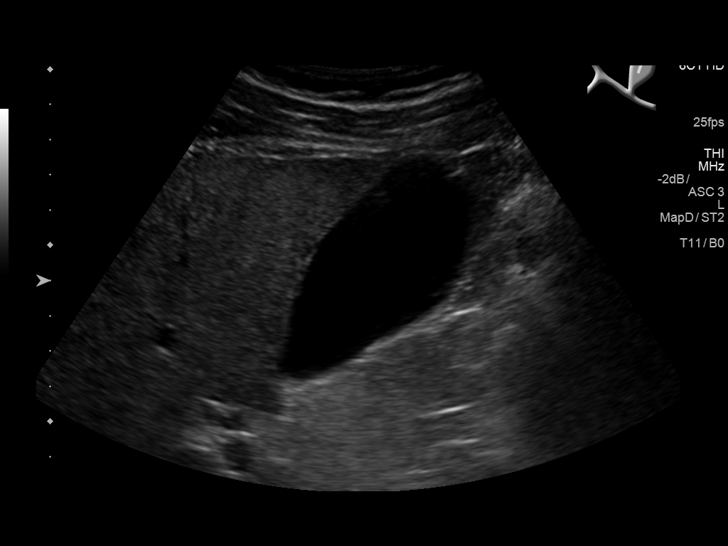
[im 18/72]
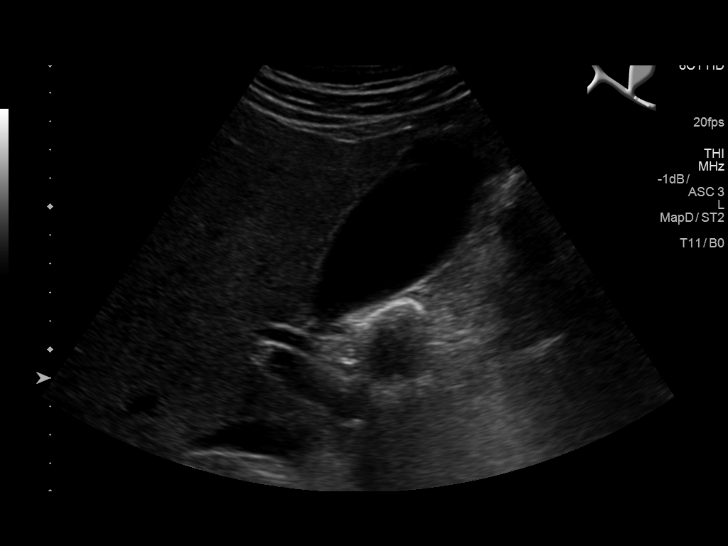
[im 24/72]
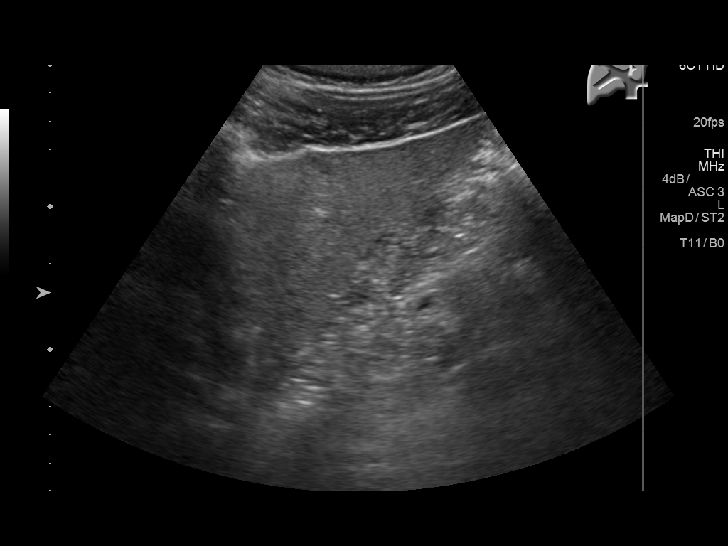
[im 27/72]
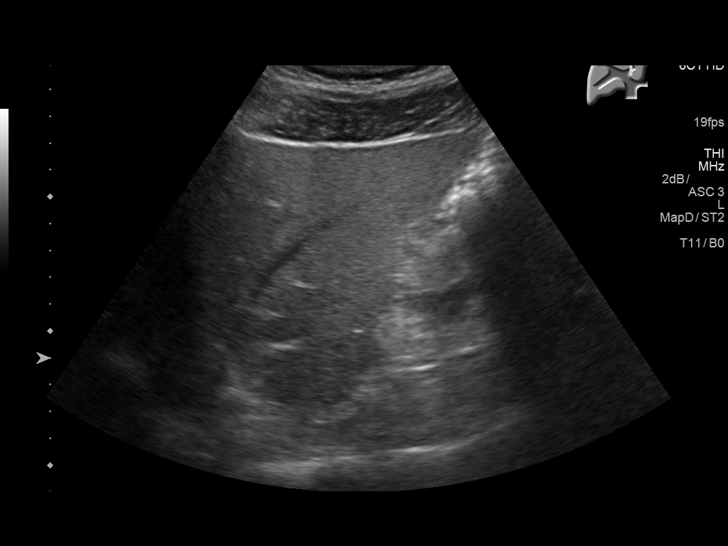
[im 33/72]
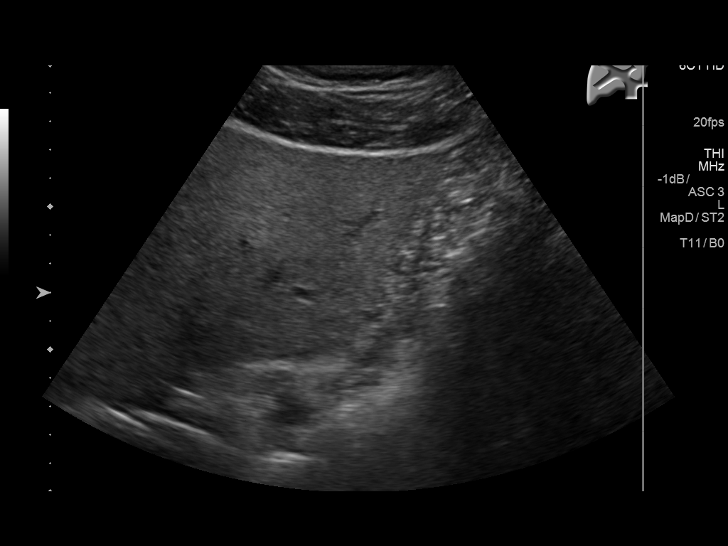
[im 39/72]
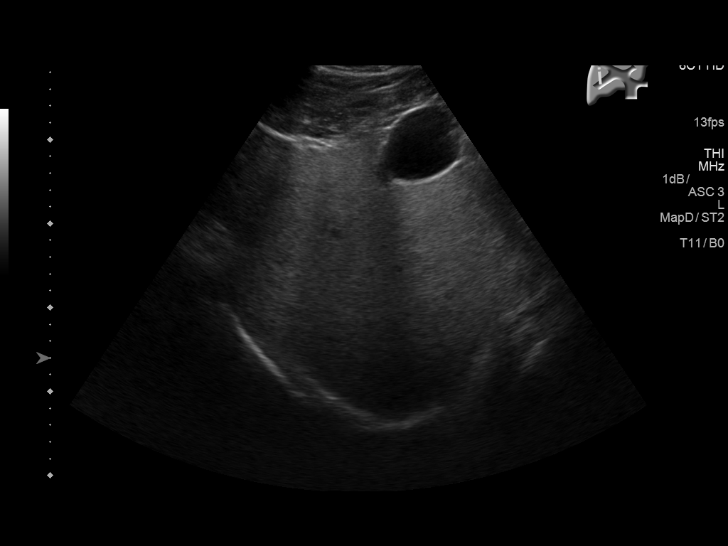
[im 45/72]
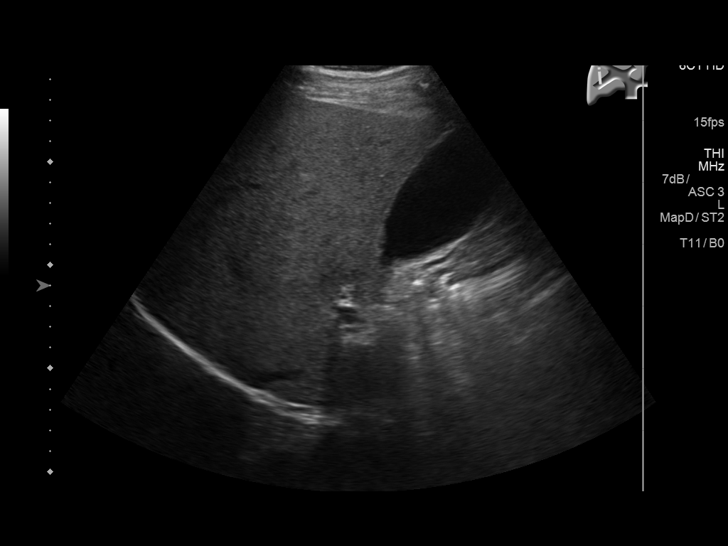
[im 48/72]
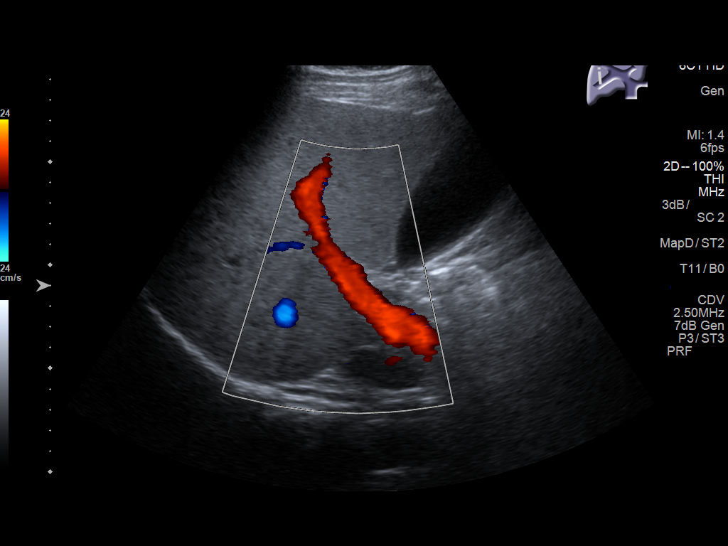
[im 54/72]
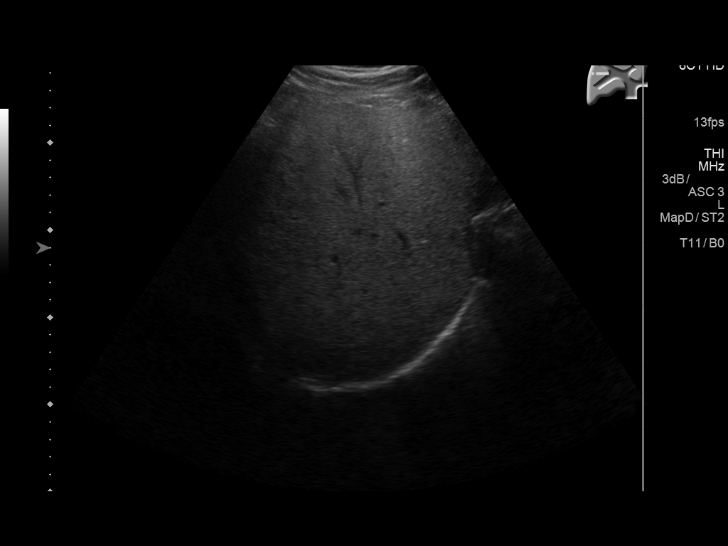
[im 60/72]
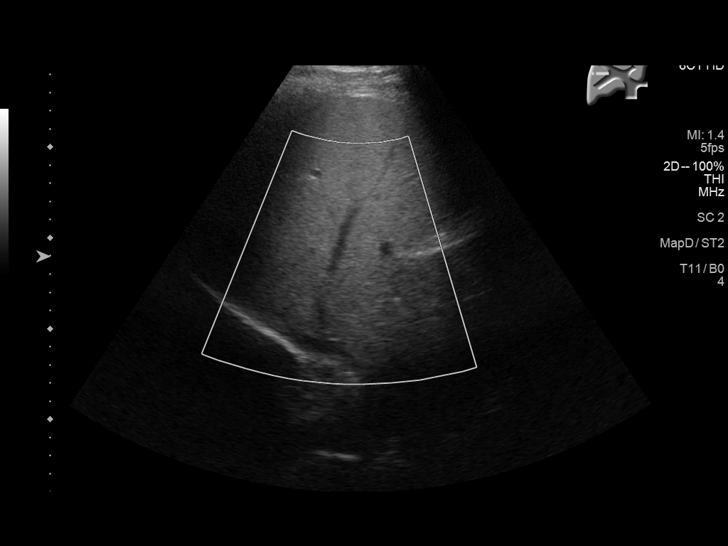
[im 66/72]
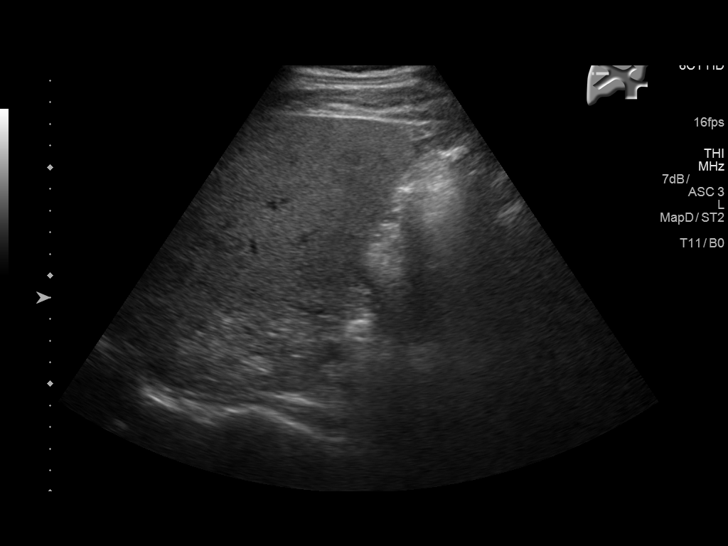
[im 72/72]
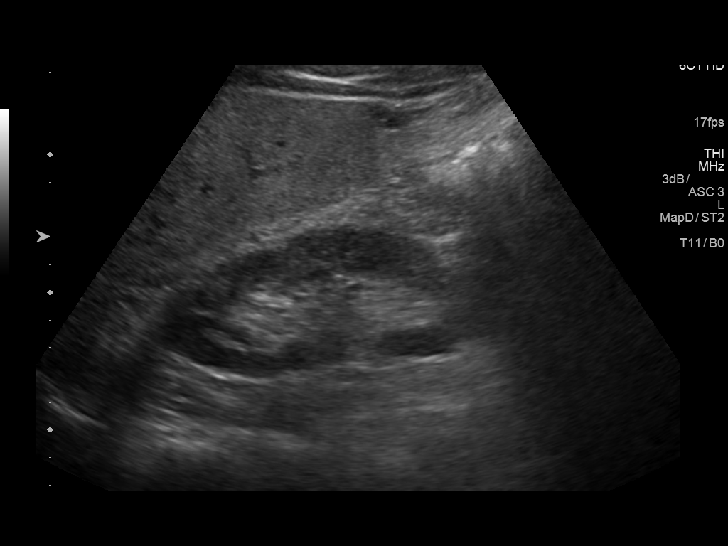

[14 of 25 positions shown; findings below may reference images not displayed]

FINDINGS: Gallbladder:

No gallstones or wall thickening visualized. No sonographic Murphy
sign noted by sonographer.

Common bile duct:

Diameter: 6 mm

Liver:

No focal lesion identified. Mild prominence of echogenicity without
sparing or limited acoustic penetration to confirm steatosis.
Antegrade flow in the imaged hepatic and portal venous system.
IMPRESSION: Negative exam.  No cholelithiasis.

## 2018-11-09 DIAGNOSIS — C4442 Squamous cell carcinoma of skin of scalp and neck: Secondary | ICD-10-CM | POA: Diagnosis not present

## 2018-12-22 ENCOUNTER — Other Ambulatory Visit: Payer: Self-pay | Admitting: Family Medicine

## 2018-12-22 NOTE — Telephone Encounter (Signed)
Requested Prescriptions  Pending Prescriptions Disp Refills  . Choline Fenofibrate (FENOFIBRIC ACID) 135 MG CPDR [Pharmacy Med Name: FENOFIBRIC ACID DR 135 MG CAP] 90 capsule 0    Sig: TAKE 1 CAPSULE BY MOUTH EVERY DAY IN THE MORNING     Cardiovascular:  Antilipid - Fibric Acid Derivatives Failed - 12/22/2018  2:03 AM      Failed - Triglycerides in normal range and within 360 days    Triglycerides  Date Value Ref Range Status  09/25/2018 255 (H) 0 - 149 mg/dL Final   Triglycerides Piccolo,Waived  Date Value Ref Range Status  03/06/2018 269 (H) <150 mg/dL Final    Comment:                            Normal                   <150                         Borderline High     150 - 199                         High                200 - 499                         Very High                >499          Passed - Total Cholesterol in normal range and within 360 days    Cholesterol, Total  Date Value Ref Range Status  09/25/2018 183 100 - 199 mg/dL Final   Cholesterol Piccolo, Waived  Date Value Ref Range Status  03/06/2018 179 <200 mg/dL Final    Comment:                            Desirable                <200                         Borderline High      200- 239                         High                     >239          Passed - LDL in normal range and within 360 days    LDL Calculated  Date Value Ref Range Status  09/25/2018 82 0 - 99 mg/dL Final         Passed - HDL in normal range and within 360 days    HDL  Date Value Ref Range Status  09/25/2018 50 >39 mg/dL Final         Passed - ALT in normal range and within 180 days    ALT  Date Value Ref Range Status  09/25/2018 22 0 - 44 IU/L Final   ALT (SGPT) Piccolo, Waived  Date Value Ref Range Status  03/06/2018 33 10 - 47 U/L Final         Passed -  AST in normal range and within 180 days    AST  Date Value Ref Range Status  09/25/2018 23 0 - 40 IU/L Final   AST (SGOT) Piccolo, Waived  Date Value Ref Range  Status  03/06/2018 45 (H) 11 - 38 U/L Final         Passed - Cr in normal range and within 180 days    Creatinine, Ser  Date Value Ref Range Status  09/25/2018 0.96 0.76 - 1.27 mg/dL Final         Passed - eGFR in normal range and within 180 days    GFR calc Af Amer  Date Value Ref Range Status  09/25/2018 103 >59 mL/min/1.73 Final   GFR calc non Af Amer  Date Value Ref Range Status  09/25/2018 89 >59 mL/min/1.73 Final         Passed - Valid encounter within last 12 months    Recent Outpatient Visits          2 months ago Essential hypertension   [facility] Crissman, Jeannette How, MD   9 months ago Essential hypertension   [facility] Crissman, Jeannette How, MD   1 year ago Essential hypertension   [place] [facility], Jeannette How, MD   1 year ago Essential hypertension   [facility] Crissman, Jeannette How, MD   1 year ago Essential hypertension   [place], Jeannette How, MD      Future Appointments            In 3 months Crissman, Jeannette How, MD [facility]

## 2019-03-24 ENCOUNTER — Other Ambulatory Visit: Payer: Self-pay | Admitting: Family Medicine

## 2019-04-04 ENCOUNTER — Ambulatory Visit (INDEPENDENT_AMBULATORY_CARE_PROVIDER_SITE_OTHER): Payer: BC Managed Care – PPO | Admitting: Family Medicine

## 2019-04-04 ENCOUNTER — Other Ambulatory Visit: Payer: Self-pay

## 2019-04-04 ENCOUNTER — Encounter: Payer: Self-pay | Admitting: Family Medicine

## 2019-04-04 DIAGNOSIS — K219 Gastro-esophageal reflux disease without esophagitis: Secondary | ICD-10-CM | POA: Diagnosis not present

## 2019-04-04 DIAGNOSIS — I10 Essential (primary) hypertension: Secondary | ICD-10-CM

## 2019-04-04 DIAGNOSIS — E785 Hyperlipidemia, unspecified: Secondary | ICD-10-CM

## 2019-04-04 MED ORDER — FENOFIBRIC ACID 135 MG PO CPDR: 135.0000 mg | DELAYED_RELEASE_CAPSULE | Freq: Every day | ORAL | 3 refills | Status: DC

## 2019-04-04 NOTE — Assessment & Plan Note (Signed)
The current medical regimen is effective;  continue present plan and medications.  

## 2019-04-04 NOTE — Progress Notes (Signed)
There were no vitals taken for this visit.   Subjective:    Patient ID: David Barrera, male    DOB: [birth year], 53 y.o.   MRN: [id number]  HPI: David Barrera is a 53 y.o. male  Med check   Telemedicine using audio/video telecommunications for a synchronous communication visit. Today's visit due to COVID-19 isolation precautions I connected with and verified that I am speaking with the correct person using two identifiers.   I discussed the limitations, risks, security and privacy concerns of performing an evaluation and management service by telecommunication and the availability of in person appointments. I also discussed with the patient that there may be a patient responsible charge related to this service. The patient expressed understanding and agreed to proceed. The patient's location is I am at home.  Relevant past medical, surgical, family and social history reviewed and updated as indicated. Interim medical history since our last visit reviewed. Allergies and medications reviewed and updated.  Review of Systems  Per HPI unless specifically indicated above     Objective:    There were no vitals taken for this visit.  Wt Readings from Last 3 Encounters:  09/25/18 216 lb 8 oz (98.2 kg)  03/06/18 210 lb (95.3 kg)  08/10/17 212 lb (96.2 kg)    Physical Exam  Results for orders placed or performed in visit on 09/25/18  CBC with Differential/Platelet  Result Value Ref Range   WBC 4.1 3.4 - 10.8 x10E3/uL   RBC 4.25 4.14 - 5.80 x10E6/uL   Hemoglobin 13.2 13.0 - 17.7 g/dL   Hematocrit 11.7 35.6 - 51.0 %   MCV 90 79 - 97 fL   MCH 31.1 26.6 - 33.0 pg   MCHC 34.4 31.5 - 35.7 g/dL   RDW 70.1 41.0 - 30.1 %   Platelets 352 150 - 450 x10E3/uL   Neutrophils 40 Not Estab. %   Lymphs 46 Not Estab. %   Monocytes 9 Not Estab. %   Eos 3 Not Estab. %   Basos 1 Not Estab. %   Neutrophils Absolute 1.6 1.4 - 7.0 x10E3/uL   Lymphocytes Absolute 1.9 0.7 - 3.1 x10E3/uL   Monocytes Absolute 0.4 0.1 - 0.9 x10E3/uL   EOS (ABSOLUTE) 0.1 0.0 - 0.4 x10E3/uL   Basophils Absolute 0.1 0.0 - 0.2 x10E3/uL   Immature Granulocytes 1 Not Estab. %   Immature Grans (Abs) 0.0 0.0 - 0.1 x10E3/uL  Comprehensive metabolic panel  Result Value Ref Range   Glucose 106 (H) 65 - 99 mg/dL   BUN 16 6 - 24 mg/dL   Creatinine, Ser 3.14 0.76 - 1.27 mg/dL   GFR calc non Af Amer 89 >59 mL/min/1.73   GFR calc Af Amer 103 >59 mL/min/1.73   BUN/Creatinine Ratio 17 9 - 20   Sodium 138 134 - 144 mmol/L   Potassium 3.8 3.5 - 5.2 mmol/L   Chloride 95 (L) 96 - 106 mmol/L   CO2 23 20 - 29 mmol/L   Calcium 9.8 8.7 - 10.2 mg/dL   Total Protein 7.0 6.0 - 8.5 g/dL   Albumin 5.0 3.5 - 5.5 g/dL   Globulin, Total 2.0 1.5 - 4.5 g/dL   Albumin/Globulin Ratio 2.5 (H) 1.2 - 2.2   Bilirubin Total <0.2 0.0 - 1.2 mg/dL   Alkaline Phosphatase 41 39 - 117 IU/L   AST 23 0 - 40 IU/L   ALT 22 0 - 44 IU/L  Lipid panel  Result Value Ref Range   Cholesterol,  Total 183 100 - 199 mg/dL   Triglycerides 161255 (H) 0 - 149 mg/dL   HDL 50 >09>39 mg/dL   VLDL Cholesterol Cal 51 (H) 5 - 40 mg/dL   LDL Calculated 82 0 - 99 mg/dL   Chol/HDL Ratio 3.7 0.0 - 5.0 ratio  PSA  Result Value Ref Range   Prostate Specific Ag, Serum 1.0 0.0 - 4.0 ng/mL  TSH  Result Value Ref Range   TSH 2.010 0.450 - 4.500 uIU/mL  Urinalysis, Routine w reflex microscopic  Result Value Ref Range   Specific Gravity, UA 1.015 1.005 - 1.030   pH, UA 7.0 5.0 - 7.5   Color, UA Yellow Yellow   Appearance Ur Clear Clear   Leukocytes, UA Negative Negative   Protein, UA Negative Negative/Trace   Glucose, UA Negative Negative   Ketones, UA Negative Negative   RBC, UA Negative Negative   Bilirubin, UA Negative Negative   Urobilinogen, Ur 0.2 0.2 - 1.0 mg/dL   Nitrite, UA Negative Negative  Uric acid  Result Value Ref Range   Uric Acid 5.1 3.7 - 8.6 mg/dL      Assessment & Plan:   Problem List Items Addressed This Visit       Cardiovascular and Mediastinum   Hypertension    The current medical regimen is effective;  continue present plan and medications.       Relevant Medications   Choline Fenofibrate (FENOFIBRIC ACID) 135 MG CPDR     Digestive   Acid reflux    The current medical regimen is effective;  continue present plan and medications.         Other   Hyperlipidemia    The current medical regimen is effective;  continue present plan and medications.       Relevant Medications   Choline Fenofibrate (FENOFIBRIC ACID) 135 MG CPDR      I discussed the assessment and treatment plan with the patient. The patient was provided an opportunity to ask questions and all were answered. The patient agreed with the plan and demonstrated an understanding of the instructions.   The patient was advised to call back or seek an in-person evaluation if the symptoms worsen or if the condition fails to improve as anticipated.   I provided 21+ minutes of time during this encounter. Follow up plan: Return in about 6 months (around 10/04/2019) for Physical Exam, uric acid.

## 2019-04-10 ENCOUNTER — Telehealth: Payer: Self-pay | Admitting: Family Medicine

## 2019-04-10 NOTE — Telephone Encounter (Signed)
Called pt to go over script screening for covid-19, no answer, left voicemail °

## 2019-04-11 ENCOUNTER — Other Ambulatory Visit: Payer: BC Managed Care – PPO

## 2019-04-11 DIAGNOSIS — Z20828 Contact with and (suspected) exposure to other viral communicable diseases: Secondary | ICD-10-CM | POA: Diagnosis not present

## 2019-04-25 ENCOUNTER — Other Ambulatory Visit: Payer: Self-pay

## 2019-04-25 ENCOUNTER — Other Ambulatory Visit: Payer: BC Managed Care – PPO

## 2019-04-25 DIAGNOSIS — I10 Essential (primary) hypertension: Secondary | ICD-10-CM

## 2019-04-25 DIAGNOSIS — E785 Hyperlipidemia, unspecified: Secondary | ICD-10-CM

## 2019-04-25 LAB — LP+ALT+AST PICCOLO, WAIVED
ALT (SGPT) Piccolo, Waived: 28 U/L (ref 10–47)
AST (SGOT) Piccolo, Waived: 32 U/L (ref 11–38)
Chol/HDL Ratio Piccolo,Waive: 2.8 mg/dL
Cholesterol Piccolo, Waived: 169 mg/dL (ref <200)
HDL Chol Piccolo, Waived: 60 mg/dL (ref >59)
LDL Chol Calc Piccolo Waived: 33 mg/dL (ref <100)
Triglycerides Piccolo,Waived: 381 mg/dL — ABNORMAL HIGH (ref <150)
VLDL Chol Calc Piccolo,Waive: 76 mg/dL — ABNORMAL HIGH (ref <30)

## 2019-04-26 LAB — BASIC METABOLIC PANEL
BUN/Creatinine Ratio: 25 — ABNORMAL HIGH (ref 9–20)
BUN: 20 mg/dL (ref 6–24)
CO2: 25 mmol/L (ref 20–29)
Calcium: 9.8 mg/dL (ref 8.7–10.2)
Chloride: 100 mmol/L (ref 96–106)
Creatinine, Ser: 0.81 mg/dL (ref 0.76–1.27)
GFR calc Af Amer: 116 mL/min/{1.73_m2} (ref >59)
GFR calc non Af Amer: 101 mL/min/{1.73_m2} (ref >59)
Glucose: 102 mg/dL — ABNORMAL HIGH (ref 65–99)
Potassium: 4 mmol/L (ref 3.5–5.2)
Sodium: 141 mmol/L (ref 134–144)

## 2019-04-30 ENCOUNTER — Encounter: Payer: Self-pay | Admitting: Family Medicine

## 2019-08-20 ENCOUNTER — Ambulatory Visit: Payer: BC Managed Care – PPO | Admitting: Family Medicine

## 2019-08-23 ENCOUNTER — Ambulatory Visit (INDEPENDENT_AMBULATORY_CARE_PROVIDER_SITE_OTHER): Payer: BC Managed Care – PPO | Admitting: Family Medicine

## 2019-08-23 ENCOUNTER — Other Ambulatory Visit: Payer: Self-pay

## 2019-08-23 ENCOUNTER — Encounter: Payer: Self-pay | Admitting: Family Medicine

## 2019-08-23 VITALS — BP 149/92 | HR 79 | Temp 98.5°F

## 2019-08-23 DIAGNOSIS — R1901 Right upper quadrant abdominal swelling, mass and lump: Secondary | ICD-10-CM | POA: Diagnosis not present

## 2019-08-23 NOTE — Progress Notes (Signed)
BP (!) 149/92   Pulse 79   Temp 98.5 F (36.9 C)   SpO2 97%    Subjective:    Patient ID: David Barrera, male    DOB: 1965-12-24, 53 y.o.   MRN: [id number]  HPI: David Barrera is a 53 y.o. male  Chief Complaint  Patient presents with  . Mass    Right side, pain, had an ultrasound in the past, nothing was found   Patient presenting today with a firm lump on right mid abdominal area that's been present for about 3 years now. Does not seem to grow or change, no skin changes, no injury or past surgeries in the area. Denies N/V, constipation, abdominal pain (deep), but does have diarrhea which he states he's had for years. Sometimes dull pain, sometimes sharp. No noticed pattern with when it will hurt. Had a RUQ abdominal u/s in 2017 which was normal. Not trying anything for relief.   Relevant past medical, surgical, family and social history reviewed and updated as indicated. Interim medical history since our last visit reviewed. Allergies and medications reviewed and updated.  Review of Systems  Per HPI unless specifically indicated above     Objective:    BP (!) 149/92   Pulse 79   Temp 98.5 F (36.9 C)   SpO2 97%   Wt Readings from Last 3 Encounters:  09/25/18 216 lb 8 oz (98.2 kg)  03/06/18 210 lb (95.3 kg)  08/10/17 212 lb (96.2 kg)    Physical Exam Vitals signs and nursing note reviewed.  Constitutional:      Appearance: Normal appearance.  HENT:     Head: Atraumatic.  Eyes:     Extraocular Movements: Extraocular movements intact.     Conjunctiva/sclera: Conjunctivae normal.  Neck:     Musculoskeletal: Normal range of motion and neck supple.  Cardiovascular:     Rate and Rhythm: Normal rate and regular rhythm.  Pulmonary:     Effort: Pulmonary effort is normal.     Breath sounds: Normal breath sounds.  Musculoskeletal: Normal range of motion.  Skin:    General: Skin is warm and dry.     Comments: Lipomatous lump about 1-1.5 inches across palpable on  right mid abdominal area. No hernia defect palpable, no redness or skin changes, heat  Neurological:     General: No focal deficit present.     Mental Status: He is oriented to person, place, and time.  Psychiatric:        Mood and Affect: Mood normal.        Thought Content: Thought content normal.        Judgment: Judgment normal.     Results for orders placed or performed in visit on 04/25/19  LP+ALT+AST Piccolo, [employer]  Result Value Ref Range   ALT (SGPT) Piccolo, Waived 28 10 - 47 U/L   AST (SGOT) Piccolo, Waived 32 11 - 38 U/L   Cholesterol Piccolo, Waived 169 <200 mg/dL   HDL Chol Piccolo, Waived 60 >59 mg/dL   Triglycerides Piccolo,Waived 381 (H) <150 mg/dL   Chol/HDL Ratio Piccolo,Waive 2.8 mg/dL   LDL Chol Calc Piccolo Waived 33 <100 mg/dL   VLDL Chol Calc Piccolo,Waive 76 (H) <30 mg/dL  Basic metabolic panel  Result Value Ref Range   Glucose 102 (H) 65 - 99 mg/dL   BUN 20 6 - 24 mg/dL   Creatinine, Ser 6.38 0.76 - 1.27 mg/dL   GFR calc non Af Amer 101 >59 mL/min/1.73  GFR calc Af Amer 116 >59 mL/min/1.73   BUN/Creatinine Ratio 25 (H) 9 - 20   Sodium 141 134 - 144 mmol/L   Potassium 4.0 3.5 - 5.2 mmol/L   Chloride 100 96 - 106 mmol/L   CO2 25 20 - 29 mmol/L   Calcium 9.8 8.7 - 10.2 mg/dL      Assessment & Plan:   Problem List Items Addressed This Visit    None    Visit Diagnoses    Right upper quadrant abdominal mass    -  Primary   Suspect lipoma, will obtain soft tissue u/s as mass feels superficial. Will proceed based on these results   Relevant Orders   [place] CHEST SOFT TISSUE       Follow up plan: Return if symptoms worsen or fail to improve.

## 2019-08-27 ENCOUNTER — Telehealth: Payer: Self-pay | Admitting: Family Medicine

## 2019-08-27 DIAGNOSIS — R1901 Right upper quadrant abdominal swelling, mass and lump: Secondary | ICD-10-CM

## 2019-08-27 NOTE — Telephone Encounter (Signed)
-----   Message from Lionel December sent at 08/27/2019 11:46 AM EDT ----- Regarding: [place] Order Good morning, Per Radiology scheduling, the order for this patient needs to be changed to [id number], US Abdomen Limited.  Thanks

## 2019-08-30 DIAGNOSIS — Z23 Encounter for immunization: Secondary | ICD-10-CM | POA: Diagnosis not present

## 2019-08-31 ENCOUNTER — Telehealth: Payer: Self-pay | Admitting: Family Medicine

## 2019-08-31 NOTE — Telephone Encounter (Signed)
Mallory notified

## 2019-08-31 NOTE — Telephone Encounter (Signed)
Copied from [place] [id number]. Topic: General - Other >> Aug 31, 2019 10:36 AM Rayann Heman wrote: Reason for CRM: Mallory calling with imaging and would like a call back regarding imaging orders. Pt has two orders and wants to confirm this.  Looks like the order had needed to be changed to u/s abdomen limited so that is the one needing to be done but please confirm with imaging that this will be looking for soft tissue mass (which was why I chose the only soft tissue option for trunk). DO NOT think this issue is intra-abdominal and he's had a normal abdominal u/s for this already

## 2019-09-03 ENCOUNTER — Other Ambulatory Visit: Payer: Self-pay

## 2019-09-03 ENCOUNTER — Ambulatory Visit: Payer: BC Managed Care – PPO

## 2019-09-03 ENCOUNTER — Ambulatory Visit
Admission: RE | Admit: 2019-09-03 | Discharge: 2019-09-03 | Disposition: A | Discharge status: Home / Self Care | Payer: BC Managed Care – PPO | Source: Ambulatory Visit | Attending: Family Medicine | Admitting: Family Medicine

## 2019-09-03 DIAGNOSIS — R1901 Right upper quadrant abdominal swelling, mass and lump: Secondary | ICD-10-CM | POA: Diagnosis not present

## 2019-10-08 ENCOUNTER — Ambulatory Visit: Payer: Self-pay | Admitting: Family Medicine

## 2019-10-19 ENCOUNTER — Encounter: Payer: Self-pay | Admitting: Family Medicine

## 2019-11-20 ENCOUNTER — Encounter: Payer: Self-pay | Admitting: Family Medicine

## 2019-11-20 ENCOUNTER — Other Ambulatory Visit: Payer: Self-pay

## 2019-11-20 DIAGNOSIS — K219 Gastro-esophageal reflux disease without esophagitis: Secondary | ICD-10-CM

## 2019-11-20 MED ORDER — OMEPRAZOLE 20 MG PO CPDR: 20.0000 mg | DELAYED_RELEASE_CAPSULE | Freq: Every day | ORAL | 4 refills | Status: DC

## 2019-11-20 NOTE — Telephone Encounter (Signed)
Refill request for Omeprazole 20mg .  Next OV 12/26/2019

## 2019-11-29 ENCOUNTER — Other Ambulatory Visit: Payer: Self-pay

## 2019-11-29 DIAGNOSIS — E785 Hyperlipidemia, unspecified: Secondary | ICD-10-CM

## 2019-11-29 NOTE — Telephone Encounter (Signed)
Can you see if he has enough to make it to his appointment?

## 2019-11-29 NOTE — Telephone Encounter (Signed)
LVM requesting call back to see if Pt has enough medication to make it to his appt 12/26/2019 with Roosvelt Maser, PA-C.

## 2019-11-29 NOTE — Telephone Encounter (Signed)
Rx refill request for rosuvastatin (CRESTOR) 20 MG tablet.   LOV: 08/23/2019. Next OV: 12/26/2019. Both with Roosvelt Maser, PA-C.

## 2019-11-30 NOTE — Telephone Encounter (Signed)
Called and left a message asking patient if he has enough medication to get to his next appt.

## 2019-11-30 NOTE — Telephone Encounter (Signed)
Patient states he will not have enough medication to last until his next appointment. Patient unsure of exact amount he has at time of call.

## 2019-11-30 NOTE — Telephone Encounter (Signed)
Routing to provider  

## 2019-12-01 MED ORDER — ROSUVASTATIN CALCIUM 20 MG PO TABS: 20.0000 mg | ORAL_TABLET | Freq: Every day | ORAL | 0 refills | Status: DC

## 2019-12-05 ENCOUNTER — Other Ambulatory Visit: Payer: Self-pay

## 2019-12-05 DIAGNOSIS — I10 Essential (primary) hypertension: Secondary | ICD-10-CM

## 2019-12-05 DIAGNOSIS — M1A00X0 Idiopathic chronic gout, unspecified site, without tophus (tophi): Secondary | ICD-10-CM

## 2019-12-05 MED ORDER — ALLOPURINOL 100 MG PO TABS: 100.0000 mg | ORAL_TABLET | Freq: Every day | ORAL | 1 refills | Status: DC

## 2019-12-05 MED ORDER — HYDROCHLOROTHIAZIDE 25 MG PO TABS: 25.0000 mg | ORAL_TABLET | Freq: Every day | ORAL | 1 refills | Status: DC

## 2019-12-05 NOTE — Telephone Encounter (Signed)
Refill request for HCTZ and Allopurinol LOV: 08/23/2019 Next Appt: 12/23/2019

## 2019-12-06 ENCOUNTER — Other Ambulatory Visit: Payer: Self-pay

## 2019-12-06 DIAGNOSIS — I10 Essential (primary) hypertension: Secondary | ICD-10-CM

## 2019-12-06 NOTE — Telephone Encounter (Signed)
Refill request for Amlodipine LOV: 08/23/2019 Next Appt:12/26/2019

## 2019-12-07 MED ORDER — AMLODIPINE BESYLATE 5 MG PO TABS: 5.0000 mg | ORAL_TABLET | Freq: Every day | ORAL | 1 refills | Status: DC

## 2019-12-10 ENCOUNTER — Other Ambulatory Visit: Payer: Self-pay | Admitting: Family Medicine

## 2019-12-10 DIAGNOSIS — E785 Hyperlipidemia, unspecified: Secondary | ICD-10-CM

## 2019-12-10 NOTE — Telephone Encounter (Signed)
Pharmacy request 90 supply of rosuvastatin  Filled on 12/01/2019 for 30 day supply. LOV 08/23/19 NOV 12/26/19

## 2019-12-11 NOTE — Telephone Encounter (Signed)
See message below from the [facility].  

## 2019-12-21 ENCOUNTER — Other Ambulatory Visit: Payer: Self-pay

## 2019-12-21 DIAGNOSIS — I10 Essential (primary) hypertension: Secondary | ICD-10-CM

## 2019-12-21 NOTE — Telephone Encounter (Signed)
Refill request for Metoprolol LOV: 08/23/2019 Next Appt: cancelled appointment for 12/26/2019 due to financial reasons

## 2019-12-23 MED ORDER — METOPROLOL SUCCINATE ER 100 MG PO TB24: 100.0000 mg | ORAL_TABLET | Freq: Every day | ORAL | 0 refills | Status: DC

## 2019-12-26 ENCOUNTER — Encounter: Payer: Self-pay | Admitting: Family Medicine

## 2020-01-19 IMAGING — US US ABDOMEN LIMITED
1 series · 14 of 23 positions shown · non-contrast
Comparison: None.

CLINICAL DATA: Right upper quadrant subcutaneous mass

EXAM:
ULTRASOUND ABDOMEN LIMITED

[Series 1: us abdomen limited · 0.09mm/px · 14 of 23 slices shown]
[im 1/23]
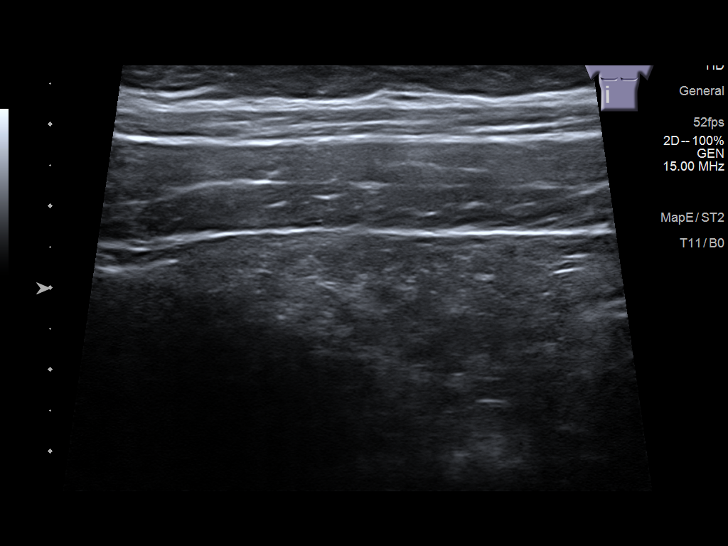
[im 3/23]
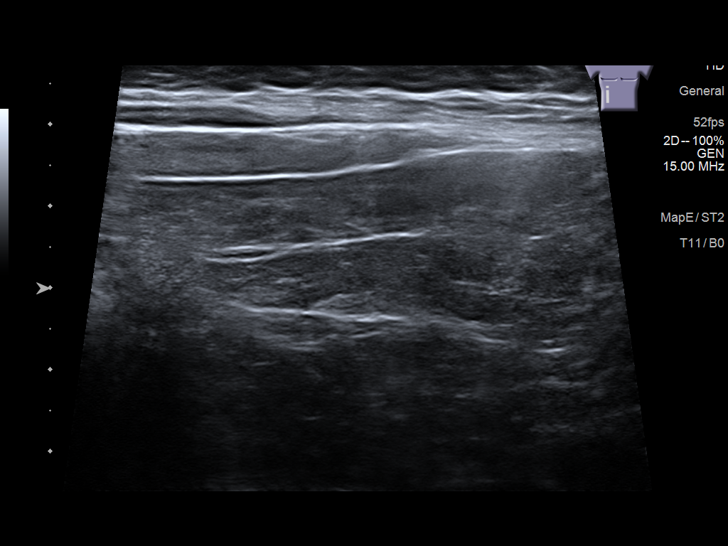
[im 5/23]
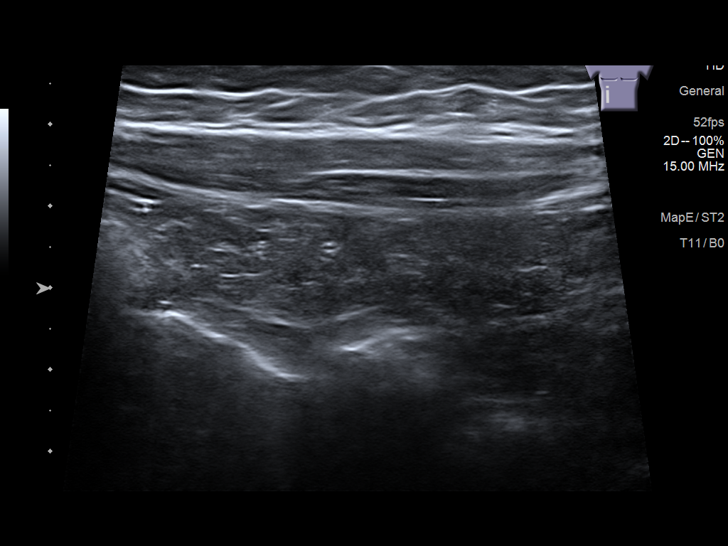
[im 6/23]
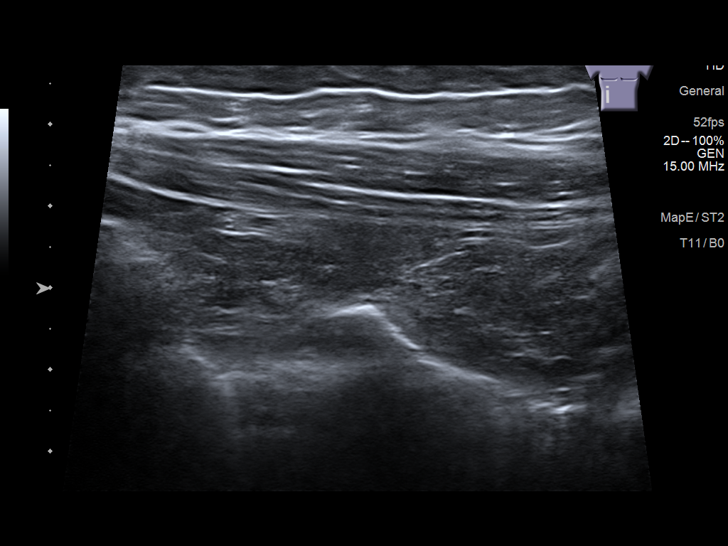
[im 8/23]
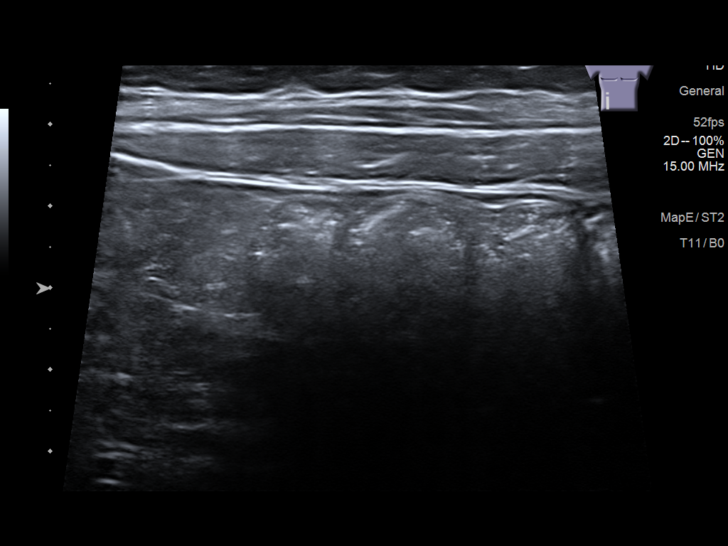
[im 10/23]
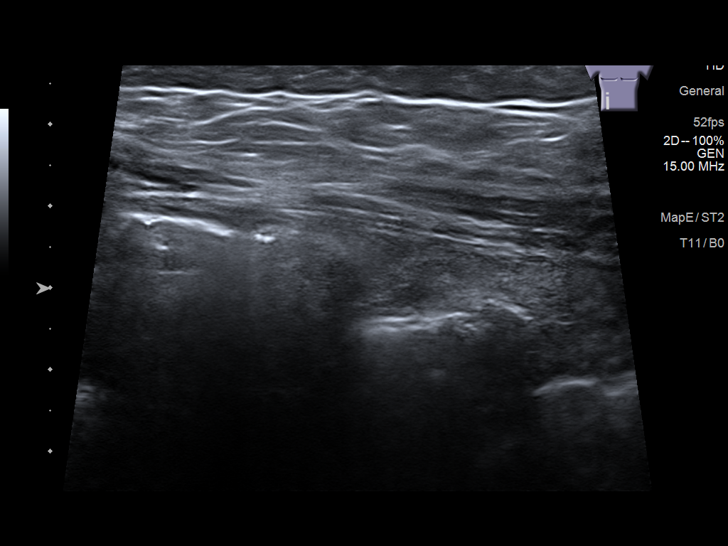
[im 11/23]
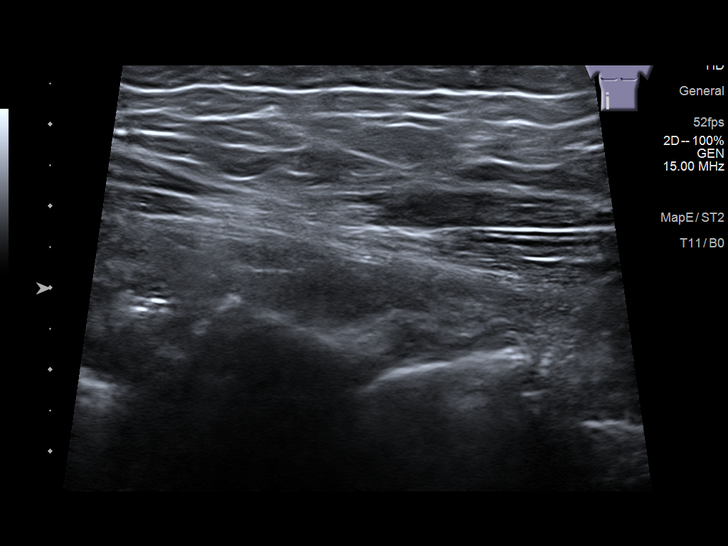
[im 13/23]
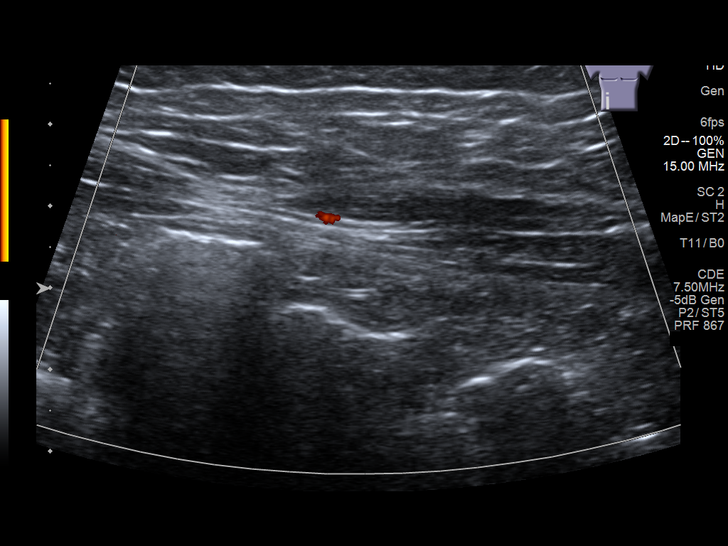
[im 14/23]
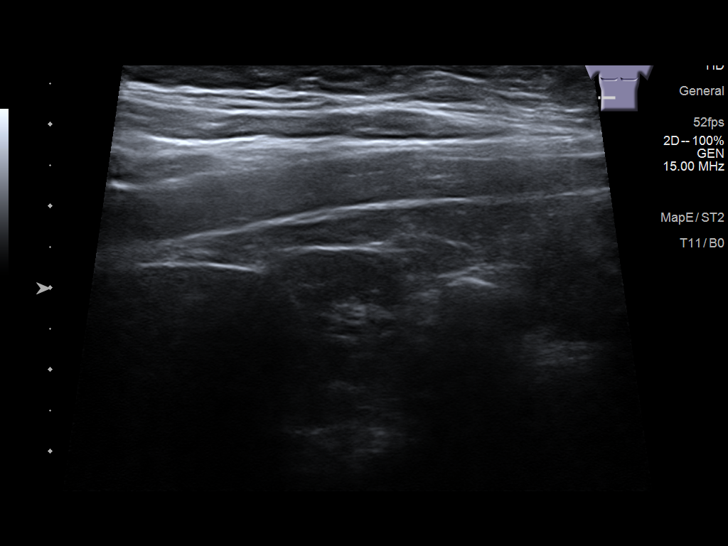
[im 16/23]
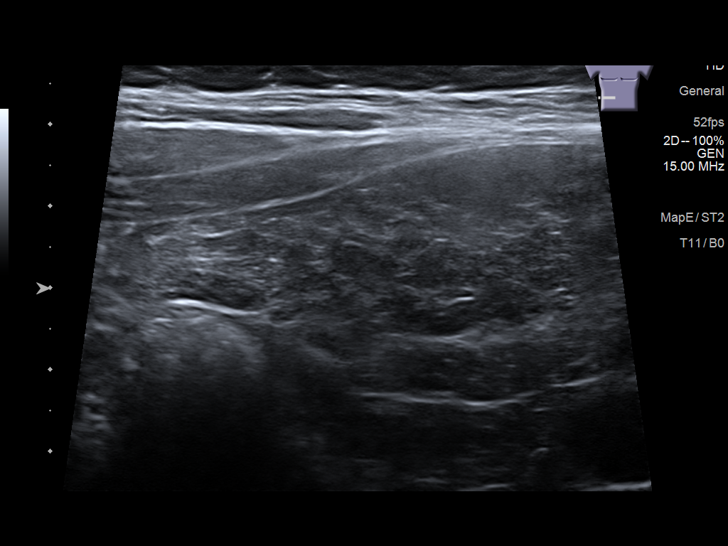
[im 18/23]
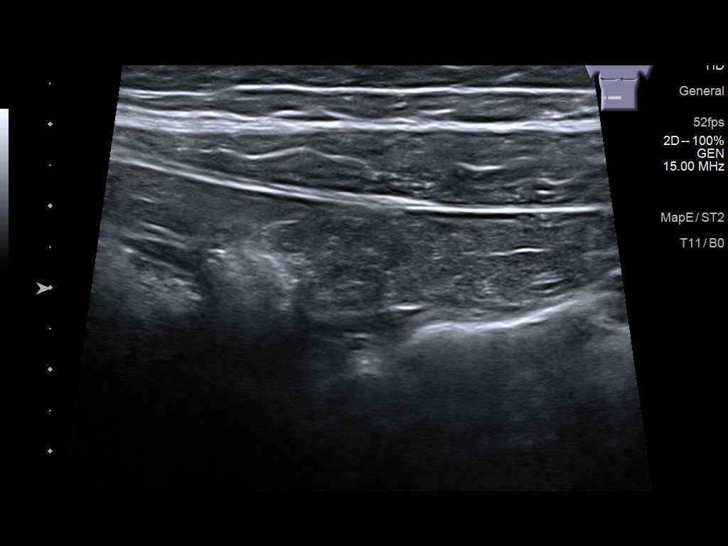
[im 19/23]
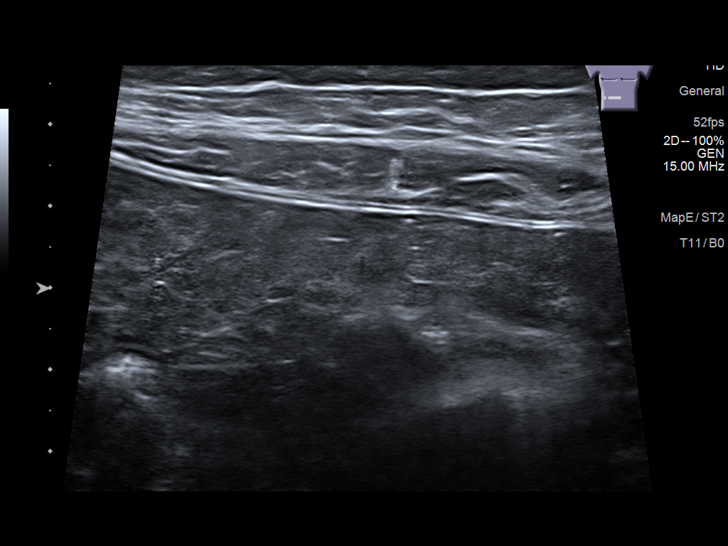
[im 21/23]
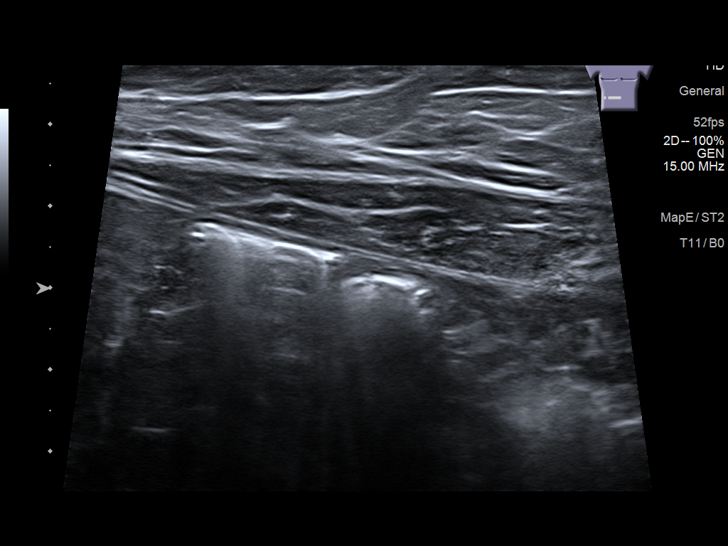
[im 23/23]
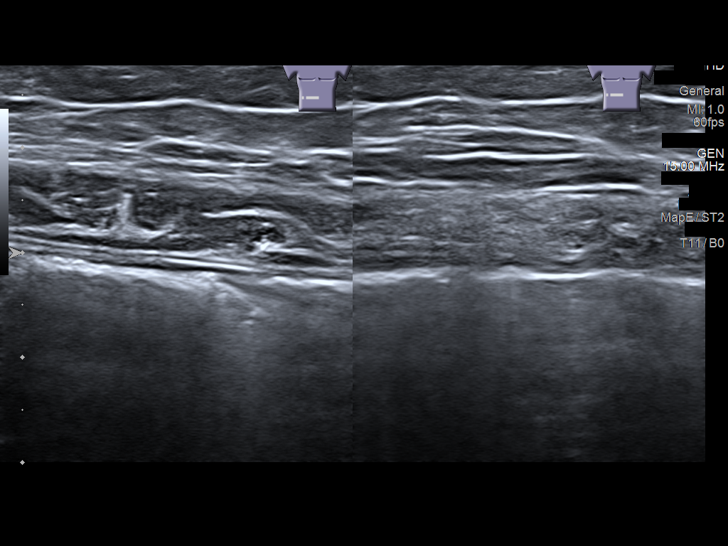

[14 of 23 positions shown; findings below may reference images not displayed]

FINDINGS: Within the area of interest no overlying soft tissue mass or
loculated fluid collection is seen. There is a normal appearance to
the soft tissues.
IMPRESSION: No soft tissue mass or fluid collection within the area of interest.

## 2020-01-28 ENCOUNTER — Ambulatory Visit (INDEPENDENT_AMBULATORY_CARE_PROVIDER_SITE_OTHER): Payer: BC Managed Care – PPO | Admitting: Family Medicine

## 2020-01-28 ENCOUNTER — Encounter: Payer: Self-pay | Admitting: Family Medicine

## 2020-01-28 ENCOUNTER — Other Ambulatory Visit: Payer: Self-pay

## 2020-01-28 VITALS — BP 140/88 | HR 84 | Temp 98.1°F | Ht 70.5 in | Wt 221.0 lb

## 2020-01-28 DIAGNOSIS — Z125 Encounter for screening for malignant neoplasm of prostate: Secondary | ICD-10-CM | POA: Diagnosis not present

## 2020-01-28 DIAGNOSIS — K219 Gastro-esophageal reflux disease without esophagitis: Secondary | ICD-10-CM

## 2020-01-28 DIAGNOSIS — I10 Essential (primary) hypertension: Secondary | ICD-10-CM | POA: Diagnosis not present

## 2020-01-28 DIAGNOSIS — E785 Hyperlipidemia, unspecified: Secondary | ICD-10-CM | POA: Diagnosis not present

## 2020-01-28 DIAGNOSIS — Z1211 Encounter for screening for malignant neoplasm of colon: Secondary | ICD-10-CM | POA: Diagnosis not present

## 2020-01-28 DIAGNOSIS — M1A00X0 Idiopathic chronic gout, unspecified site, without tophus (tophi): Secondary | ICD-10-CM

## 2020-01-28 DIAGNOSIS — N529 Male erectile dysfunction, unspecified: Secondary | ICD-10-CM

## 2020-01-28 DIAGNOSIS — Z0000 Encounter for general adult medical examination without abnormal findings: Secondary | ICD-10-CM

## 2020-01-28 LAB — UA/M W/RFLX CULTURE, ROUTINE
Bilirubin, UA: NEGATIVE
Glucose, UA: NEGATIVE
Ketones, UA: NEGATIVE
Leukocytes,UA: NEGATIVE
Nitrite, UA: NEGATIVE
Protein,UA: NEGATIVE
RBC, UA: NEGATIVE
Specific Gravity, UA: 1.015 (ref 1.005–1.030)
Urobilinogen, Ur: 0.2 mg/dL (ref 0.2–1.0)
pH, UA: 7 (ref 5.0–7.5)

## 2020-01-28 MED ORDER — FENOFIBRIC ACID 135 MG PO CPDR: 135.0000 mg | DELAYED_RELEASE_CAPSULE | Freq: Every day | ORAL | 3 refills | Status: DC

## 2020-01-28 MED ORDER — ROSUVASTATIN CALCIUM 20 MG PO TABS: 20.0000 mg | ORAL_TABLET | Freq: Every day | ORAL | 1 refills | Status: DC

## 2020-01-28 MED ORDER — SILDENAFIL CITRATE 20 MG PO TABS: ORAL_TABLET | ORAL | 0 refills | Status: DC

## 2020-01-28 MED ORDER — METOPROLOL SUCCINATE ER 100 MG PO TB24: 100.0000 mg | ORAL_TABLET | Freq: Every day | ORAL | 0 refills | Status: DC

## 2020-01-28 MED ORDER — AMLODIPINE BESYLATE 5 MG PO TABS: 5.0000 mg | ORAL_TABLET | Freq: Every day | ORAL | 1 refills | Status: DC

## 2020-01-28 NOTE — Progress Notes (Signed)
BP 140/88   Pulse 84   Temp 98.1 F (36.7 C) (Oral)   Ht 5' 10.5" (1.791 m)   Wt 221 lb (100.2 kg)   SpO2 97%   BMI 31.26 kg/m    Subjective:    Patient ID: David Barrera, male    DOB: 1966-04-30, 54 y.o.   MRN: [id number]  HPI: David Barrera is a 54 y.o. male presenting on 01/28/2020 for comprehensive medical examination. Current medical complaints include:see below  HTN - Not checking home BPs. Taking medicines faithfully without side effects. Denies CP, SOB, HAs, dizziness. Trying to eat well and stay active. HLD on crestor and fenofibrate, denies side effects.   Gout - on allopurinol, no recent gout flares.   Having some erectile issues the past year. Lost his job about 2 years ago, feels he's been a bit depressed off and on since this. Unsure if it's this or due to his BP medications. Has never tried any medications for this.   He currently lives with: Interim Problems from his last visit: no  Depression Screen done today and results listed below:  Depression screen [facility] 2/9 01/28/2020 08/23/2019 09/25/2018 03/06/2018 08/10/2017  Decreased Interest 0 0 1 0 0  Down, Depressed, Hopeless 0 0 1 0 0  PHQ - 2 Score 0 0 2 0 0  Altered sleeping 0 - 0 - -  Tired, decreased energy 3 - - - -  Change in appetite 0 - - - -  Feeling bad or failure about yourself  0 - - - -  Trouble concentrating 0 - - - -  Moving slowly or fidgety/restless 0 - - - -  Suicidal thoughts 0 - - - -  PHQ-9 Score 3 - 2 - -    The patient does not have a history of falls. I did complete a risk assessment for falls. A plan of care for falls was documented.   Past Medical History:  Past Medical History:  Diagnosis Date  . Allergy   . Hyperlipidemia   . Hypertension     Surgical History:  Past Surgical History:  Procedure Laterality Date  . VASECTOMY      Medications:  Current Outpatient Medications on File Prior to Visit  Medication Sig  . allopurinol (ZYLOPRIM) 100 MG tablet Take 1 tablet  (100 mg total) by mouth daily.  Marland Kitchen aspirin EC 81 MG tablet Take 81 mg by mouth daily.  . hydrochlorothiazide (HYDRODIURIL) 25 MG tablet Take 1 tablet (25 mg total) by mouth daily.  Marland Kitchen omeprazole (PRILOSEC) 20 MG capsule Take 1 capsule (20 mg total) by mouth daily.   No current facility-administered medications on file prior to visit.    Allergies:  Allergies  Allergen Reactions  . Benazepril Hcl Cough  . Lipitor [Atorvastatin] Other (See Comments)    Myalgia leg pain    Social History:  Social History   Socioeconomic History  . Marital status: Married    Spouse name: Not on file  . Number of children: Not on file  . Years of education: Not on file  . Highest education level: Not on file  Occupational History  . Not on file  Tobacco Use  . Smoking status: Never Smoker  . Smokeless tobacco: Never Used  Substance and Sexual Activity  . Alcohol use: Yes    Alcohol/week: 25.0 standard drinks    Types: 25 Shots of liquor per week  . Drug use: No  . Sexual activity: Not on file  Other Topics Concern  . Not on file  Social History Narrative  . Not on file   Social Determinants of Health   Financial Resource Strain:   . Difficulty of Paying Living Expenses:   Food Insecurity:   . Worried About Programme researcher, broadcasting/film/video in the Last Year:   . Barista in the Last Year:   Transportation Needs:   . Freight forwarder (Medical):   Marland Kitchen Lack of Transportation (Non-Medical):   Physical Activity:   . Days of Exercise per Week:   . Minutes of Exercise per Session:   Stress:   . Feeling of Stress :   Social Connections:   . Frequency of Communication with Friends and Family:   . Frequency of Social Gatherings with Friends and Family:   . Attends Religious Services:   . Active Member of Clubs or Organizations:   . Attends Banker Meetings:   Marland Kitchen Marital Status:   Intimate Partner Violence:   . Fear of Current or Ex-Partner:   . Emotionally Abused:   Marland Kitchen  Physically Abused:   . Sexually Abused:    Social History   Tobacco Use  Smoking Status Never Smoker  Smokeless Tobacco Never Used   Social History   Substance and Sexual Activity  Alcohol Use Yes  . Alcohol/week: 25.0 standard drinks  . Types: 25 Shots of liquor per week    Family History:  Family History  Problem Relation Age of Onset  . Cancer Sister   . Diabetes Brother     Past medical history, surgical history, medications, allergies, family history and social history reviewed with patient today and changes made to appropriate areas of the chart.   Review of Systems - General ROS: negative Psychological ROS: negative Ophthalmic ROS: negative ENT ROS: negative Allergy and Immunology ROS: negative Hematological and Lymphatic ROS: negative Endocrine ROS: negative Breast ROS: negative for breast lumps Respiratory ROS: no cough, shortness of breath, or wheezing Cardiovascular ROS: no chest pain or dyspnea on exertion Gastrointestinal ROS: no abdominal pain, change in bowel habits, or black or bloody stools Genito-Urinary ROS: positive for - erectile dysfunction Musculoskeletal ROS: negative Neurological ROS: no TIA or stroke symptoms Dermatological ROS: negative All other ROS negative except what is listed above and in the HPI.      Objective:    BP 140/88   Pulse 84   Temp 98.1 F (36.7 C) (Oral)   Ht 5' 10.5" (1.791 m)   Wt 221 lb (100.2 kg)   SpO2 97%   BMI 31.26 kg/m   Wt Readings from Last 3 Encounters:  01/28/20 221 lb (100.2 kg)  09/25/18 216 lb 8 oz (98.2 kg)  03/06/18 210 lb (95.3 kg)    Physical Exam Vitals and nursing note reviewed.  Constitutional:      General: He is not in acute distress.    Appearance: He is well-developed.  HENT:     Head: Atraumatic.     Right Ear: Tympanic membrane and external ear normal.     Left Ear: Tympanic membrane and external ear normal.     Nose: Nose normal.     Mouth/Throat:     Mouth: Mucous  membranes are moist.     Pharynx: Oropharynx is clear.  Eyes:     General: No scleral icterus.    Conjunctiva/sclera: Conjunctivae normal.     Pupils: Pupils are equal, round, and reactive to light.  Cardiovascular:     Rate and  Rhythm: Normal rate and regular rhythm.     Heart sounds: Normal heart sounds. No murmur.  Pulmonary:     Effort: Pulmonary effort is normal. No respiratory distress.     Breath sounds: Normal breath sounds.  Abdominal:     General: Bowel sounds are normal. There is no distension.     Palpations: Abdomen is soft. There is no mass.     Tenderness: There is no abdominal tenderness. There is no guarding.  Genitourinary:    Comments: Declines GU exam Musculoskeletal:        General: No tenderness. Normal range of motion.     Cervical back: Normal range of motion and neck supple.  Skin:    General: Skin is warm and dry.     Findings: No rash.  Neurological:     General: No focal deficit present.     Mental Status: He is alert and oriented to person, place, and time.     Deep Tendon Reflexes: Reflexes are normal and symmetric.  Psychiatric:        Mood and Affect: Mood normal.        Behavior: Behavior normal.        Thought Content: Thought content normal.        Judgment: Judgment normal.     Results for orders placed or performed in visit on 01/28/20  CBC with Differential/Platelet  Result Value Ref Range   WBC 4.8 3.4 - 10.8 x10E3/uL   RBC 4.51 4.14 - 5.80 x10E6/uL   Hemoglobin 14.0 13.0 - 17.7 g/dL   Hematocrit 02.6 37.8 - 51.0 %   MCV 90 79 - 97 fL   MCH 31.0 26.6 - 33.0 pg   MCHC 34.6 31.5 - 35.7 g/dL   RDW 58.8 50.2 - 77.4 %   Platelets 305 150 - 450 x10E3/uL   Neutrophils 48 Not Estab. %   Lymphs 42 Not Estab. %   Monocytes 6 Not Estab. %   Eos 2 Not Estab. %   Basos 1 Not Estab. %   Neutrophils Absolute 2.3 1.4 - 7.0 x10E3/uL   Lymphocytes Absolute 2.0 0.7 - 3.1 x10E3/uL   Monocytes Absolute 0.3 0.1 - 0.9 x10E3/uL   EOS (ABSOLUTE)  0.1 0.0 - 0.4 x10E3/uL   Basophils Absolute 0.0 0.0 - 0.2 x10E3/uL   Immature Granulocytes 1 Not Estab. %   Immature Grans (Abs) 0.0 0.0 - 0.1 x10E3/uL  Comprehensive metabolic panel  Result Value Ref Range   Glucose 93 65 - 99 mg/dL   BUN 18 6 - 24 mg/dL   Creatinine, Ser 1.28 0.76 - 1.27 mg/dL   GFR calc non Af Amer 99 >59 mL/min/1.73   GFR calc Af Amer 115 >59 mL/min/1.73   BUN/Creatinine Ratio 22 (H) 9 - 20   Sodium 138 134 - 144 mmol/L   Potassium 4.1 3.5 - 5.2 mmol/L   Chloride 97 96 - 106 mmol/L   CO2 25 20 - 29 mmol/L   Calcium 9.5 8.7 - 10.2 mg/dL   Total Protein 7.5 6.0 - 8.5 g/dL   Albumin 4.9 3.8 - 4.9 g/dL   Globulin, Total 2.6 1.5 - 4.5 g/dL   Albumin/Globulin Ratio 1.9 1.2 - 2.2   Bilirubin Total 0.2 0.0 - 1.2 mg/dL   Alkaline Phosphatase 39 39 - 117 IU/L   AST 26 0 - 40 IU/L   ALT 20 0 - 44 IU/L  Lipid Panel w/o Chol/HDL Ratio  Result Value Ref Range   Cholesterol, Total  197 100 - 199 mg/dL   Triglycerides 321 (H) 0 - 149 mg/dL   HDL 54 >39 mg/dL   VLDL Cholesterol Cal 53 (H) 5 - 40 mg/dL   LDL Chol Calc (NIH) 90 0 - 99 mg/dL  TSH  Result Value Ref Range   TSH 1.420 0.450 - 4.500 uIU/mL  UA/M w/rflx Culture, Routine   Specimen: Urine   URINE  Result Value Ref Range   Specific Gravity, UA 1.015 1.005 - 1.030   pH, UA 7.0 5.0 - 7.5   Color, UA Yellow Yellow   Appearance Ur Clear Clear   Leukocytes,UA Negative Negative   Protein,UA Negative Negative/Trace   Glucose, UA Negative Negative   Ketones, UA Negative Negative   RBC, UA Negative Negative   Bilirubin, UA Negative Negative   Urobilinogen, Ur 0.2 0.2 - 1.0 mg/dL   Nitrite, UA Negative Negative  PSA  Result Value Ref Range   Prostate Specific Ag, Serum 1.3 0.0 - 4.0 ng/mL  Testosterone, Free, Total, SHBG  Result Value Ref Range   Testosterone 199 (L) 264 - 916 ng/dL   Testosterone, Free WILL FOLLOW    Sex Hormone Binding 16.8 (L) 19.3 - 76.4 nmol/L      Assessment & Plan:   Problem List  Items Addressed This Visit      Cardiovascular and Mediastinum   Hypertension - Primary    BPs stable and under good control, continue current regimen and start home monitoring as able.      Relevant Medications   sildenafil (REVATIO) 20 MG tablet   rosuvastatin (CRESTOR) 20 MG tablet   metoprolol succinate (TOPROL-XL) 100 MG 24 hr tablet   Choline Fenofibrate (FENOFIBRIC ACID) 135 MG CPDR   amLODipine (NORVASC) 5 MG tablet   Other Relevant Orders   CBC with Differential/Platelet (Completed)   Comprehensive metabolic panel (Completed)   TSH (Completed)   UA/M w/rflx Culture, Routine (Completed)     Digestive   Acid reflux    Stable and under good control, continue current regimen        Other   Hyperlipidemia    Recheck lipids, adjust as needed. Continue current regimen and diet and exercise      Relevant Medications   sildenafil (REVATIO) 20 MG tablet   rosuvastatin (CRESTOR) 20 MG tablet   metoprolol succinate (TOPROL-XL) 100 MG 24 hr tablet   Choline Fenofibrate (FENOFIBRIC ACID) 135 MG CPDR   amLODipine (NORVASC) 5 MG tablet   Other Relevant Orders   Lipid Panel w/o Chol/HDL Ratio (Completed)   Gout    Stable and under good control, continue current regimen      Erectile dysfunction    Check testosterone levels, trial prn sildenafil. Work on stress control      Relevant Orders   Testosterone, Free, Total, SHBG (Completed)    Other Visit Diagnoses    Annual physical exam       Screening for prostate cancer       Relevant Orders   PSA (Completed)   Colon cancer screening       Relevant Orders   Ambulatory referral to Gastroenterology       Discussed aspirin prophylaxis for myocardial infarction prevention and decision was made to continue ASA  LABORATORY TESTING:  Health maintenance labs ordered today as discussed above.   The natural history of prostate cancer and ongoing controversy regarding screening and potential treatment outcomes of prostate  cancer has been discussed with the patient. The meaning of a  false positive PSA and a false negative PSA has been discussed. He indicates understanding of the limitations of this screening test and wishes to proceed with screening PSA testing.   IMMUNIZATIONS:   - Tdap: Tetanus vaccination status reviewed: last tetanus booster within 10 years. - Influenza: Refused  SCREENING: - Colonoscopy: Ordered today  Discussed with patient purpose of the colonoscopy is to detect colon cancer at curable precancerous or early stages   PATIENT COUNSELING:    Sexuality: Discussed sexually transmitted diseases, partner selection, use of condoms, avoidance of unintended pregnancy  and contraceptive alternatives.   Advised to avoid cigarette smoking.  I discussed with the patient that most people either abstain from alcohol or drink within safe limits (<=14/week and <=4 drinks/occasion for males, <=7/weeks and <= 3 drinks/occasion for females) and that the risk for alcohol disorders and other health effects rises proportionally with the number of drinks per week and how often a drinker exceeds daily limits.  Discussed cessation/primary prevention of drug use and availability of treatment for abuse.   Diet: Encouraged to adjust caloric intake to maintain  or achieve ideal body weight, to reduce intake of dietary saturated fat and total fat, to limit sodium intake by avoiding high sodium foods and not adding table salt, and to maintain adequate dietary potassium and calcium preferably from fresh fruits, vegetables, and low-fat dairy products.    stressed the importance of regular exercise  Injury prevention: Discussed safety belts, safety helmets, smoke detector, smoking near bedding or upholstery.   Dental health: Discussed importance of regular tooth brushing, flossing, and dental visits.   Follow up plan: NEXT PREVENTATIVE PHYSICAL DUE IN 1 YEAR. Return in about 6 months (around 07/30/2020) for 6 month  f/u.

## 2020-01-30 DIAGNOSIS — N529 Male erectile dysfunction, unspecified: Secondary | ICD-10-CM | POA: Insufficient documentation

## 2020-01-30 LAB — CBC WITH DIFFERENTIAL/PLATELET
Basophils Absolute: 0 10*3/uL (ref 0.0–0.2)
Basos: 1 %
EOS (ABSOLUTE): 0.1 10*3/uL (ref 0.0–0.4)
Eos: 2 %
Hematocrit: 40.5 % (ref 37.5–51.0)
Hemoglobin: 14 g/dL (ref 13.0–17.7)
Immature Grans (Abs): 0 10*3/uL (ref 0.0–0.1)
Immature Granulocytes: 1 %
Lymphocytes Absolute: 2 10*3/uL (ref 0.7–3.1)
Lymphs: 42 %
MCH: 31 pg (ref 26.6–33.0)
MCHC: 34.6 g/dL (ref 31.5–35.7)
MCV: 90 fL (ref 79–97)
Monocytes Absolute: 0.3 10*3/uL (ref 0.1–0.9)
Monocytes: 6 %
Neutrophils Absolute: 2.3 10*3/uL (ref 1.4–7.0)
Neutrophils: 48 %
Platelets: 305 10*3/uL (ref 150–450)
RBC: 4.51 x10E6/uL (ref 4.14–5.80)
RDW: 12.5 % (ref 11.6–15.4)
WBC: 4.8 10*3/uL (ref 3.4–10.8)

## 2020-01-30 LAB — COMPREHENSIVE METABOLIC PANEL
ALT: 20 IU/L (ref 0–44)
AST: 26 IU/L (ref 0–40)
Albumin/Globulin Ratio: 1.9 (ref 1.2–2.2)
Albumin: 4.9 g/dL (ref 3.8–4.9)
Alkaline Phosphatase: 39 IU/L (ref 39–117)
BUN/Creatinine Ratio: 22 — ABNORMAL HIGH (ref 9–20)
BUN: 18 mg/dL (ref 6–24)
Bilirubin Total: 0.2 mg/dL (ref 0.0–1.2)
CO2: 25 mmol/L (ref 20–29)
Calcium: 9.5 mg/dL (ref 8.7–10.2)
Chloride: 97 mmol/L (ref 96–106)
Creatinine, Ser: 0.83 mg/dL (ref 0.76–1.27)
GFR calc Af Amer: 115 mL/min/{1.73_m2} (ref >59)
GFR calc non Af Amer: 99 mL/min/{1.73_m2} (ref >59)
Globulin, Total: 2.6 g/dL (ref 1.5–4.5)
Glucose: 93 mg/dL (ref 65–99)
Potassium: 4.1 mmol/L (ref 3.5–5.2)
Sodium: 138 mmol/L (ref 134–144)
Total Protein: 7.5 g/dL (ref 6.0–8.5)

## 2020-01-30 LAB — LIPID PANEL W/O CHOL/HDL RATIO
Cholesterol, Total: 197 mg/dL (ref 100–199)
HDL: 54 mg/dL (ref >39)
LDL Chol Calc (NIH): 90 mg/dL (ref 0–99)
Triglycerides: 321 mg/dL — ABNORMAL HIGH (ref 0–149)
VLDL Cholesterol Cal: 53 mg/dL — ABNORMAL HIGH (ref 5–40)

## 2020-01-30 LAB — TESTOSTERONE, FREE, TOTAL, SHBG
Sex Hormone Binding: 16.8 nmol/L — ABNORMAL LOW (ref 19.3–76.4)
Testosterone, Free: 7.9 pg/mL (ref 7.2–24.0)
Testosterone: 199 ng/dL — ABNORMAL LOW (ref 264–916)

## 2020-01-30 LAB — TSH: TSH: 1.42 u[IU]/mL (ref 0.450–4.500)

## 2020-01-30 LAB — PSA: Prostate Specific Ag, Serum: 1.3 ng/mL (ref 0.0–4.0)

## 2020-01-30 NOTE — Assessment & Plan Note (Signed)
Stable and under good control, continue current regimen 

## 2020-01-30 NOTE — Assessment & Plan Note (Signed)
BPs stable and under good control, continue current regimen and start home monitoring as able.

## 2020-01-30 NOTE — Assessment & Plan Note (Signed)
Recheck lipids, adjust as needed. Continue current regimen and diet and exercise

## 2020-01-30 NOTE — Assessment & Plan Note (Signed)
Check testosterone levels, trial prn sildenafil. Work on stress control

## 2020-02-04 ENCOUNTER — Ambulatory Visit (INDEPENDENT_AMBULATORY_CARE_PROVIDER_SITE_OTHER): Payer: Self-pay | Admitting: Gastroenterology

## 2020-02-04 DIAGNOSIS — Z1211 Encounter for screening for malignant neoplasm of colon: Secondary | ICD-10-CM

## 2020-02-04 NOTE — Progress Notes (Signed)
Gastroenterology Pre-Procedure Review  Request Date: Friday 03/28/20 Requesting Physician: Dr. Tobi Bastos  PATIENT REVIEW QUESTIONS: The patient responded to the following health history questions as indicated:    1. Are you having any GI issues? no 2. Do you have a personal history of Polyps? unsure of the year but has had polyps 3. Do you have a family history of Colon Cancer or Polyps? no 4. Diabetes Mellitus? no 5. Joint replacements in the past 12 months?no 6. Major health problems in the past 3 months?no 7. Any artificial heart valves, MVP, or defibrillator?no    MEDICATIONS & ALLERGIES:    Patient reports the following regarding taking any anticoagulation/antiplatelet therapy:   Plavix, Coumadin, Eliquis, Xarelto, Lovenox, Pradaxa, Brilinta, or Effient? no Aspirin? Yes 81 mg daily  Patient confirms/reports the following medications:  Current Outpatient Medications  Medication Sig Dispense Refill  . allopurinol (ZYLOPRIM) 100 MG tablet Take 1 tablet (100 mg total) by mouth daily. 90 tablet 1  . amLODipine (NORVASC) 5 MG tablet Take 1 tablet (5 mg total) by mouth daily. 90 tablet 1  . aspirin EC 81 MG tablet Take 81 mg by mouth daily.    . Choline Fenofibrate (FENOFIBRIC ACID) 135 MG CPDR Take 135 mg by mouth daily. 90 capsule 3  . hydrochlorothiazide (HYDRODIURIL) 25 MG tablet Take 1 tablet (25 mg total) by mouth daily. 90 tablet 1  . metoprolol succinate (TOPROL-XL) 100 MG 24 hr tablet Take 1 tablet (100 mg total) by mouth daily. Take with or immediately following a meal. 90 tablet 0  . omeprazole (PRILOSEC) 20 MG capsule Take 1 capsule (20 mg total) by mouth daily. 90 capsule 4  . rosuvastatin (CRESTOR) 20 MG tablet Take 1 tablet (20 mg total) by mouth daily. 90 tablet 1  . sildenafil (REVATIO) 20 MG tablet Take 1-5 tablets prn 30 tablet 0   No current facility-administered medications for this visit.    Patient confirms/reports the following allergies:  Allergies  Allergen  Reactions  . Benazepril Hcl Cough  . Lipitor [Atorvastatin] Other (See Comments)    Myalgia leg pain    No orders of the defined types were placed in this encounter.   AUTHORIZATION INFORMATION Primary Insurance: 1D#: Group #:  Secondary Insurance: 1D#: Group #:  SCHEDULE INFORMATION: Date: 03/28/20 Time: Location:[facility]

## 2020-02-12 ENCOUNTER — Other Ambulatory Visit: Payer: Self-pay | Admitting: Family Medicine

## 2020-02-12 MED ORDER — TESTOSTERONE 50 MG/5GM (1%) TD GEL: 5.0000 g | Freq: Every day | TRANSDERMAL | 2 refills | Status: DC

## 2020-04-27 ENCOUNTER — Other Ambulatory Visit: Payer: Self-pay | Admitting: Family Medicine

## 2020-04-27 DIAGNOSIS — M1A00X0 Idiopathic chronic gout, unspecified site, without tophus (tophi): Secondary | ICD-10-CM

## 2020-05-22 ENCOUNTER — Encounter: Payer: Self-pay | Admitting: Family Medicine

## 2020-05-25 ENCOUNTER — Other Ambulatory Visit: Payer: Self-pay | Admitting: Family Medicine

## 2020-05-25 MED ORDER — SILDENAFIL CITRATE 20 MG PO TABS: ORAL_TABLET | ORAL | 0 refills | Status: DC

## 2020-05-27 ENCOUNTER — Other Ambulatory Visit: Payer: Self-pay | Admitting: Family Medicine

## 2020-05-27 DIAGNOSIS — I10 Essential (primary) hypertension: Secondary | ICD-10-CM

## 2020-05-27 NOTE — Telephone Encounter (Signed)
Requested Prescriptions  Pending Prescriptions Disp Refills  . hydrochlorothiazide (HYDRODIURIL) 25 MG tablet [Pharmacy Med Name: HYDROCHLOROTHIAZIDE 25 MG TAB] 90 tablet 0    Sig: TAKE 1 TABLET BY MOUTH EVERY DAY     Cardiovascular: Diuretics - Thiazide Failed - 05/27/2020  1:30 AM      Failed - Last BP in normal range    BP Readings from Last 1 Encounters:  01/28/20 140/88         Passed - Ca in normal range and within 360 days    Calcium  Date Value Ref Range Status  01/28/2020 9.5 8.7 - 10.2 mg/dL Final         Passed - Cr in normal range and within 360 days    Creatinine, Ser  Date Value Ref Range Status  01/28/2020 0.83 0.76 - 1.27 mg/dL Final         Passed - K in normal range and within 360 days    Potassium  Date Value Ref Range Status  01/28/2020 4.1 3.5 - 5.2 mmol/L Final         Passed - Na in normal range and within 360 days    Sodium  Date Value Ref Range Status  01/28/2020 138 134 - 144 mmol/L Final         Passed - Valid encounter within last 6 months    Recent Outpatient Visits          4 months ago Essential hypertension   [facility] Particia Nearing, [place]   9 months ago Right upper quadrant abdominal mass   [facility] [place], Salley Hews, [place]   1 year ago Essential hypertension   [facility] Crissman, Redge Gainer, [place]   1 year ago Essential hypertension   [facility] Crissman, Redge Gainer, [place]   2 years ago Essential hypertension   [facility] Crissman, Redge Gainer, MD      Future Appointments            In 2 months Maurice March, Salley Hews, PA-C [facility]

## 2020-05-30 ENCOUNTER — Encounter: Payer: Self-pay | Admitting: Family Medicine

## 2020-05-30 ENCOUNTER — Other Ambulatory Visit: Payer: Self-pay

## 2020-05-30 ENCOUNTER — Other Ambulatory Visit
Admission: RE | Admit: 2020-05-30 | Discharge: 2020-05-30 | Disposition: A | Discharge status: Home / Self Care | Payer: BC Managed Care – PPO | Source: Ambulatory Visit | Attending: Gastroenterology | Admitting: Gastroenterology

## 2020-05-30 DIAGNOSIS — Z01812 Encounter for preprocedural laboratory examination: Secondary | ICD-10-CM | POA: Insufficient documentation

## 2020-05-30 DIAGNOSIS — Z20822 Contact with and (suspected) exposure to covid-19: Secondary | ICD-10-CM | POA: Diagnosis not present

## 2020-05-30 LAB — SARS CORONAVIRUS 2 (TAT 6-24 HRS): SARS Coronavirus 2: NEGATIVE

## 2020-05-30 MED ORDER — SILDENAFIL CITRATE 20 MG PO TABS: ORAL_TABLET | ORAL | 3 refills | Status: DC

## 2020-06-02 ENCOUNTER — Other Ambulatory Visit: Payer: Self-pay

## 2020-06-02 ENCOUNTER — Other Ambulatory Visit: Payer: Self-pay | Admitting: Family Medicine

## 2020-06-02 ENCOUNTER — Encounter: Payer: Self-pay | Admitting: Gastroenterology

## 2020-06-02 DIAGNOSIS — I10 Essential (primary) hypertension: Secondary | ICD-10-CM

## 2020-06-02 DIAGNOSIS — M1A00X0 Idiopathic chronic gout, unspecified site, without tophus (tophi): Secondary | ICD-10-CM

## 2020-06-02 MED ORDER — GOLYTELY 236 G PO SOLR: ORAL | 0 refills | Status: DC

## 2020-06-02 MED ORDER — GOLYTELY 236 G PO SOLR: 4000.0000 mL | Freq: Once | ORAL | 0 refills | Status: DC

## 2020-06-02 NOTE — Telephone Encounter (Signed)
Requested Prescriptions  Pending Prescriptions Disp Refills  . metoprolol succinate (TOPROL-XL) 100 MG 24 hr tablet [Pharmacy Med Name: METOPROLOL SUCC ER 100 MG TAB] 90 tablet 0    Sig: TAKE 1 TABLET (100 MG TOTAL) BY MOUTH DAILY. TAKE WITH OR IMMEDIATELY FOLLOWING A MEAL.     Cardiovascular:  Beta Blockers Failed - 06/02/2020  3:53 PM      Failed - Last BP in normal range    BP Readings from Last 1 Encounters:  01/28/20 140/88         Passed - Last Heart Rate in normal range    Pulse Readings from Last 1 Encounters:  01/28/20 84         Passed - Valid encounter within last 6 months    Recent Outpatient Visits          4 months ago Essential hypertension   [facility] Roosvelt Maser [place], [place]   9 months ago Right upper quadrant abdominal mass   [facility] [place], Salley Hews, [place]   1 year ago Essential hypertension   [facility] Crissman, Redge Gainer, [place]   1 year ago Essential hypertension   [facility] Crissman, Redge Gainer, [place]   2 years ago Essential hypertension   [facility] Crissman, Redge Gainer, MD      Future Appointments            In 1 month Maurice March, Salley Hews, PA-C [facility]           . allopurinol (ZYLOPRIM) 100 MG tablet [Pharmacy Med Name: ALLOPURINOL 100 MG TABLET] 90 tablet 1    Sig: TAKE 1 TABLET BY MOUTH EVERY DAY     Endocrinology:  Gout Agents Failed - 06/02/2020  3:53 PM      Failed - Uric Acid in normal range and within 360 days    Uric Acid  Date Value Ref Range Status  09/25/2018 5.1 3.7 - 8.6 mg/dL Final    Comment:               Therapeutic target for gout patients: <6.0         Passed - Cr in normal range and within 360 days    Creatinine, Ser  Date Value Ref Range Status  01/28/2020 0.83 0.76 - 1.27 mg/dL Final         Passed - Valid encounter within last 12 months    Recent Outpatient Visits          4 months ago Essential hypertension   [facility] Particia Nearing, [place]   9 months ago Right upper quadrant abdominal mass   [facility] [place], Salley Hews, [place]   1 year ago Essential hypertension   [facility] Crissman, Redge Gainer, MD   1 year ago Essential hypertension   [facility] Crissman, Redge Gainer, MD   2 years ago Essential hypertension   [facility] Crissman, Redge Gainer, MD      Future Appointments            In 1 month Maurice March, Sal[place] Hews, PA-C [facility]           Patient needs Uric acid rechecked Last OV says to continue med.

## 2020-06-03 ENCOUNTER — Encounter: Payer: Self-pay | Admitting: Gastroenterology

## 2020-06-03 ENCOUNTER — Ambulatory Visit: Payer: BC Managed Care – PPO | Admitting: Anesthesiology

## 2020-06-03 ENCOUNTER — Encounter
Admission: RE | Disposition: A | Discharge status: Home / Self Care | Payer: Self-pay | Source: Home / Self Care | Attending: Gastroenterology

## 2020-06-03 ENCOUNTER — Ambulatory Visit
Admission: RE | Admit: 2020-06-03 | Discharge: 2020-06-03 | Disposition: A | Discharge status: Home / Self Care | Payer: BC Managed Care – PPO | Attending: Gastroenterology | Admitting: Gastroenterology

## 2020-06-03 DIAGNOSIS — Z7982 Long term (current) use of aspirin: Secondary | ICD-10-CM | POA: Insufficient documentation

## 2020-06-03 DIAGNOSIS — Z79899 Other long term (current) drug therapy: Secondary | ICD-10-CM | POA: Insufficient documentation

## 2020-06-03 DIAGNOSIS — Z888 Allergy status to other drugs, medicaments and biological substances status: Secondary | ICD-10-CM | POA: Diagnosis not present

## 2020-06-03 DIAGNOSIS — I10 Essential (primary) hypertension: Secondary | ICD-10-CM | POA: Insufficient documentation

## 2020-06-03 DIAGNOSIS — E785 Hyperlipidemia, unspecified: Secondary | ICD-10-CM | POA: Diagnosis not present

## 2020-06-03 DIAGNOSIS — K5790 Diverticulosis of intestine, part unspecified, without perforation or abscess without bleeding: Secondary | ICD-10-CM | POA: Insufficient documentation

## 2020-06-03 DIAGNOSIS — K640 First degree hemorrhoids: Secondary | ICD-10-CM | POA: Insufficient documentation

## 2020-06-03 DIAGNOSIS — Z1211 Encounter for screening for malignant neoplasm of colon: Secondary | ICD-10-CM | POA: Diagnosis not present

## 2020-06-03 DIAGNOSIS — K219 Gastro-esophageal reflux disease without esophagitis: Secondary | ICD-10-CM | POA: Diagnosis not present

## 2020-06-03 HISTORY — PX: COLONOSCOPY WITH PROPOFOL: SHX5780

## 2020-06-03 HISTORY — DX: Gastro-esophageal reflux disease without esophagitis: K21.9

## 2020-06-03 SURGERY — COLONOSCOPY WITH PROPOFOL
Anesthesia: General

## 2020-06-03 MED ORDER — PROPOFOL 10 MG/ML IV BOLUS
INTRAVENOUS | Status: DC | PRN
  Administered 2020-06-03: 60 mg via INTRAVENOUS
  Administered 2020-06-03 (×3): 20 mg via INTRAVENOUS

## 2020-06-03 MED ORDER — PROPOFOL 500 MG/50ML IV EMUL
INTRAVENOUS | Status: DC | PRN
  Administered 2020-06-03: 165 ug/kg/min via INTRAVENOUS

## 2020-06-03 MED ORDER — LIDOCAINE HCL (CARDIAC) PF 100 MG/5ML IV SOSY
PREFILLED_SYRINGE | INTRAVENOUS | Status: DC | PRN
  Administered 2020-06-03: 100 mg via INTRAVENOUS

## 2020-06-03 MED ORDER — SODIUM CHLORIDE 0.9 % IV SOLN
INTRAVENOUS | Status: DC
  Administered 2020-06-03: 1000 mL via INTRAVENOUS

## 2020-06-03 MED ORDER — MIDAZOLAM HCL 2 MG/2ML IJ SOLN
INTRAMUSCULAR | Status: AC
  Filled 2020-06-03: qty 2

## 2020-06-03 NOTE — Op Note (Signed)
[facility]astroenterology Patient Name: David Barrera Procedure Date: 06/03/2020 7:43 AM MRN: [id number] Account #: [id number] Date of Birth: 1965/11/12 Admit Type: Outpatient Age: 54 Room: [facility] ENDO ROOM 4 Gender: Male Note Status: Finalized Procedure:             Colonoscopy Indications:           Screening for colorectal malignant neoplasm Providers:             Midge Minium MD, MD Referring MD:          Particia Nearing (Referring MD) Medicines:             Propofol per Anesthesia Complications:         No immediate complications. Procedure:             Pre-Anesthesia Assessment:                        - Prior to the procedure, a History and Physical was                         performed, and patient medications and allergies were                         reviewed. The patient's tolerance of previous                         anesthesia was also reviewed. The risks and benefits                         of the procedure and the sedation options and risks                         were discussed with the patient. All questions were                         answered, and informed consent was obtained. Prior                         Anticoagulants: The patient has taken no previous                         anticoagulant or antiplatelet agents. ASA Grade                         Assessment: II - A patient with mild systemic disease.                         After reviewing the risks and benefits, the patient                         was deemed in satisfactory condition to undergo the                         procedure.                        After obtaining informed consent, the colonoscope was  passed under direct vision. Throughout the procedure,                         the patient's blood pressure, pulse, and oxygen                         saturations were monitored continuously. The                         Colonoscope was introduced through the  anus and                         advanced to the the cecum, identified by appendiceal                         orifice and ileocecal valve. The colonoscopy was                         performed without difficulty. The patient tolerated                         the procedure well. The quality of the bowel                         preparation was good. Findings:      The perianal and digital rectal examinations were normal.      Multiple small-mouthed diverticula were found in the entire colon.      Non-bleeding internal hemorrhoids were found during retroflexion. The       hemorrhoids were Grade I (internal hemorrhoids that do not prolapse). Impression:            - Diverticulosis in the entire examined colon.                        - Non-bleeding internal hemorrhoids.                        - No specimens collected. Recommendation:        - Discharge patient to home.                        - Resume previous diet.                        - Continue present medications.                        - Repeat colonoscopy in 7 years. Procedure Code(s):     --- Professional ---                        [phone], Colonoscopy, flexible; diagnostic, including                         collection of specimen(s) by brushing or washing, when                         performed (separate procedure) Diagnosis Code(s):     --- Professional ---  Z12.11, Encounter for screening for malignant neoplasm                         of colon CPT copyright 2019 American Medical Association. All rights reserved. The codes documented in this report are preliminary and upon coder review may  be revised to meet current compliance requirements. Midge Minium MD, MD 06/03/2020 8:21:51 AM This report has been signed electronically. Number of Addenda: 0 Note Initiated On: 06/03/2020 7:43 AM Scope Withdrawal Time: 0 hours 6 minutes 54 seconds  Total Procedure Duration: 0 hours 13 minutes 13 seconds  Estimated Blood  Loss:  Estimated blood loss: none.      [facility]

## 2020-06-03 NOTE — Anesthesia Procedure Notes (Signed)
Procedure Name: General with mask airway Performed by: Fletcher-Harrison, Ollin Hochmuth, CRNA Pre-anesthesia Checklist: Patient identified, Emergency Drugs available, Suction available and Patient being monitored Patient Re-evaluated:Patient Re-evaluated prior to induction Oxygen Delivery Method: Simple face mask Induction Type: IV induction Placement Confirmation: positive ETCO2 and CO2 detector Dental Injury: Teeth and Oropharynx as per pre-operative assessment        

## 2020-06-03 NOTE — H&P (Signed)
Midge Minium, MD [facility] [address], [address] [place], [place] [address] Phone: [phone] Fax : [phone]  Primary Care Physician:  Particia Nearing, [place] Primary Gastroenterologist:  Dr. Servando Snare  Pre-Procedure History & Physical: HPI:  David Barrera is a 54 y.o. male is here for a screening colonoscopy.   Past Medical History:  Diagnosis Date  . Allergy   . GERD (gastroesophageal reflux disease)   . Hyperlipidemia   . Hypertension     Past Surgical History:  Procedure Laterality Date  . COLONOSCOPY WITH PROPOFOL    . VASECTOMY      Prior to Admission medications   Medication Sig Start Date End Date Taking? Authorizing Provider  allopurinol (ZYLOPRIM) 100 MG tablet TAKE 1 TABLET BY MOUTH EVERY DAY 06/02/20  Yes Particia Nearing, PA-C  amLODipine (NORVASC) 5 MG tablet Take 1 tablet (5 mg total) by mouth daily. 01/28/20  Yes Particia Nearing, PA-C  aspirin EC 81 MG tablet Take 81 mg by mouth daily.   Yes [provider]  Choline Fenofibrate (FENOFIBRIC ACID) 135 MG CPDR Take 135 mg by mouth daily. 01/28/20  Yes Particia Nearing, PA-C  hydrochlorothiazide (HYDRODIURIL) 25 MG tablet TAKE 1 TABLET BY MOUTH EVERY DAY 05/27/20  Yes Particia Nearing, PA-C  metoprolol succinate (TOPROL-XL) 100 MG 24 hr tablet TAKE 1 TABLET (100 MG TOTAL) BY MOUTH DAILY. TAKE WITH OR IMMEDIATELY FOLLOWING A MEAL. 06/02/20  Yes Particia Nearing, PA-C  omeprazole (PRILOSEC) 20 MG capsule Take 1 capsule (20 mg total) by mouth daily. 11/20/19  Yes Particia Nearing, PA-C  rosuvastatin (CRESTOR) 20 MG tablet Take 1 tablet (20 mg total) by mouth daily. 01/28/20  Yes Particia Nearing, PA-C  polyethylene glycol (GOLYTELY) 236 g solution Drink one 8oz glass every 20 mins until entire container is finished starting at 5:00pm today 06/02/20   Midge Minium, MD  sildenafil (REVATIO) 20 MG tablet Take 1-5 tablets prn 05/30/20   Olevia Perches P, DO  testosterone  (ANDROGEL) 50 MG/5GM (1%) GEL Place 5 g onto the skin daily. 02/12/20   Particia Nearing, PA-C    Allergies as of 02/04/2020 - Review Complete 01/28/2020  Allergen Reaction Noted  . Benazepril hcl Cough 10/12/2016  . Lipitor [atorvastatin] Other (See Comments) 08/05/2016    Family History  Problem Relation Age of Onset  . Cancer Sister   . Diabetes Brother     Social History   Socioeconomic History  . Marital status: Married    Spouse name: Not on file  . Number of children: Not on file  . Years of education: Not on file  . Highest education level: Not on file  Occupational History  . Not on file  Tobacco Use  . Smoking status: Never Smoker  . Smokeless tobacco: Never Used  Substance and Sexual Activity  . Alcohol use: Yes    Alcohol/week: 25.0 standard drinks    Types: 25 Shots of liquor per week  . Drug use: No  . Sexual activity: Not on file  Other Topics Concern  . Not on file  Social History Narrative  . Not on file   Social Determinants of Health   Financial Resource Strain:   . Difficulty of Paying Living Expenses:   Food Insecurity:   . Worried About Programme researcher, broadcasting/film/video in the Last Year:   . Barista in the Last Year:   Transportation Needs:   . Freight forwarder (Medical):   Marland Kitchen  Lack of Transportation (Non-Medical):   Physical Activity:   . Days of Exercise per Week:   . Minutes of Exercise per Session:   Stress:   . Feeling of Stress :   Social Connections:   . Frequency of Communication with Friends and Family:   . Frequency of Social Gatherings with Friends and Family:   . Attends Religious Services:   . Active Member of Clubs or Organizations:   . Attends Banker Meetings:   Marland Kitchen Marital Status:   Intimate Partner Violence:   . Fear of Current or Ex-Partner:   . Emotionally Abused:   Marland Kitchen Physically Abused:   . Sexually Abused:     Review of Systems: See HPI, otherwise negative ROS  Physical Exam: BP (!) 164/99    Pulse (!) 110   Temp (!) 96.3 F (35.7 C) (Tympanic)   Resp 18   Ht 6' (1.829 m)   Wt 99.8 kg   SpO2 96%   BMI 29.84 kg/m  General:   Alert,  pleasant and cooperative in NAD Head:  Normocephalic and atraumatic. Neck:  Supple; no masses or thyromegaly. Lungs:  Clear throughout to auscultation.    Heart:  Regular rate and rhythm. Abdomen:  Soft, nontender and nondistended. Normal bowel sounds, without guarding, and without rebound.   Neurologic:  Alert and  oriented x4;  grossly normal neurologically.  Impression/Plan: David Barrera is now here to undergo a screening colonoscopy.  Risks, benefits, and alternatives regarding colonoscopy have been reviewed with the patient.  Questions have been answered.  All parties agreeable.

## 2020-06-03 NOTE — Transfer of Care (Signed)
Immediate Anesthesia Transfer of Care Note  Patient: David Barrera  Procedure(s) Performed: COLONOSCOPY WITH PROPOFOL (N/A )  Patient Location: Endoscopy Unit  Anesthesia Type:General  Level of Consciousness: drowsy and responds to stimulation  Airway & Oxygen Therapy: Patient Spontanous Breathing and Patient connected to face mask oxygen  Post-op Assessment: Report given to RN and Post -op Vital signs reviewed and stable  Post vital signs: Reviewed and stable  Last Vitals:  Vitals Value Taken Time  BP 110/90 06/03/20 0822  Temp    Pulse 99 06/03/20 0822  Resp 11 06/03/20 0822  SpO2 99 % 06/03/20 0822  Vitals shown include unvalidated device data.  Last Pain:  Vitals:   06/03/20 0732  TempSrc: Tympanic  PainSc: 0-No pain         Complications: No complications documented.

## 2020-06-03 NOTE — Anesthesia Preprocedure Evaluation (Signed)
Anesthesia Evaluation  Patient identified by MRN, date of birth, ID band Patient awake    Reviewed: Allergy & Precautions, H&P , NPO status , Patient's Chart, lab work & pertinent test results, reviewed documented beta blocker date and time   Airway Mallampati: II   Neck ROM: full    Dental  (+) Poor Dentition   Pulmonary neg pulmonary ROS,    Pulmonary exam normal        Cardiovascular Exercise Tolerance: Good hypertension, On Medications negative cardio ROS Normal cardiovascular exam Rhythm:regular Rate:Normal     Neuro/Psych negative neurological ROS  negative psych ROS   GI/Hepatic Neg liver ROS, GERD  Medicated,  Endo/Other  negative endocrine ROS  Renal/GU negative Renal ROS  negative genitourinary   Musculoskeletal   Abdominal   Peds  Hematology negative hematology ROS (+)   Anesthesia Other Findings Past Medical History: No date: Allergy No date: GERD (gastroesophageal reflux disease) No date: Hyperlipidemia No date: Hypertension Past Surgical History: No date: COLONOSCOPY WITH PROPOFOL No date: VASECTOMY BMI    Body Mass Index: 29.84 kg/m     Reproductive/Obstetrics negative OB ROS                             Anesthesia Physical Anesthesia Plan  ASA: II  Anesthesia Plan: General   Post-op Pain Management:    Induction:   PONV Risk Score and Plan:   Airway Management Planned:   Additional Equipment:   Intra-op Plan:   Post-operative Plan:   Informed Consent: I have reviewed the patients History and Physical, chart, labs and discussed the procedure including the risks, benefits and alternatives for the proposed anesthesia with the patient or authorized representative who has indicated his/her understanding and acceptance.     Dental Advisory Given  Plan Discussed with: CRNA  Anesthesia Plan Comments:         Anesthesia Quick Evaluation

## 2020-06-04 ENCOUNTER — Encounter: Payer: Self-pay | Admitting: Gastroenterology

## 2020-06-04 NOTE — Anesthesia Postprocedure Evaluation (Signed)
Anesthesia Post Note  Patient: David Barrera  Procedure(s) Performed: COLONOSCOPY WITH PROPOFOL (N/A )  Patient location during evaluation: PACU Anesthesia Type: General Level of consciousness: awake and alert Pain management: pain level controlled Vital Signs Assessment: post-procedure vital signs reviewed and stable Respiratory status: spontaneous breathing, nonlabored ventilation, respiratory function stable and patient connected to nasal cannula oxygen Cardiovascular status: blood pressure returned to baseline and stable Postop Assessment: no apparent nausea or vomiting Anesthetic complications: no   No complications documented.   Last Vitals:  Vitals:   06/03/20 0858 06/03/20 0859  BP:  (!) 133/95  Pulse: 88 90  Resp: 13 16  Temp:    SpO2: 98% 97%    Last Pain:  Vitals:   06/03/20 0821  TempSrc: Tympanic  PainSc:                  Yevette Edwards

## 2020-07-31 ENCOUNTER — Ambulatory Visit: Payer: BC Managed Care – PPO | Admitting: Family Medicine

## 2020-07-31 ENCOUNTER — Ambulatory Visit: Payer: BC Managed Care – PPO | Admitting: Unknown Physician Specialty

## 2020-08-14 ENCOUNTER — Ambulatory Visit: Payer: Self-pay | Admitting: Unknown Physician Specialty

## 2020-08-21 ENCOUNTER — Ambulatory Visit (INDEPENDENT_AMBULATORY_CARE_PROVIDER_SITE_OTHER): Payer: BC Managed Care – PPO | Admitting: Unknown Physician Specialty

## 2020-08-21 ENCOUNTER — Encounter: Payer: Self-pay | Admitting: Unknown Physician Specialty

## 2020-08-21 ENCOUNTER — Other Ambulatory Visit: Payer: Self-pay

## 2020-08-21 VITALS — BP 154/93 | HR 78 | Temp 98.5°F | Ht 72.0 in | Wt 214.0 lb

## 2020-08-21 DIAGNOSIS — K219 Gastro-esophageal reflux disease without esophagitis: Secondary | ICD-10-CM

## 2020-08-21 DIAGNOSIS — E785 Hyperlipidemia, unspecified: Secondary | ICD-10-CM | POA: Diagnosis not present

## 2020-08-21 DIAGNOSIS — G4733 Obstructive sleep apnea (adult) (pediatric): Secondary | ICD-10-CM

## 2020-08-21 DIAGNOSIS — I10 Essential (primary) hypertension: Secondary | ICD-10-CM | POA: Diagnosis not present

## 2020-08-21 NOTE — Progress Notes (Signed)
BP (!) 154/93 (BP Location: Left Arm, Patient Position: Sitting)   Pulse 78   Temp 98.5 F (36.9 C) (Oral)   Ht 6' (1.829 m)   Wt 214 lb (97.1 kg)   SpO2 98%   BMI 29.02 kg/m    Subjective:    Patient ID: David Barrera, male    DOB: 01-Jul-1966, 54 y.o.   MRN: [id number]  HPI: MASAKI ROTHBAUER is a 54 y.o. male  Chief Complaint  Patient presents with  . Hypertension    follow up   . Gastroesophageal Reflux  . Hyperlipidemia  . shingrex    pt wants to know if its something he should get    Hypertension Using medications without difficulty Average home BP states at home his BP is "real good" at less than 120/80  No problems or lightheadedness No chest pain with exertion or shortness of breath No Edema  Hyperlipidemia Using medications without problems: No Muscle aches  Doesn't ear junk food   The 10-year ASCVD risk score Denman George DC Jr., et al., 2013) is: 9.1%   Values used to calculate the score:     Age: 33 years     Sex: Male     Is Non-Hispanic African American: No     Diabetic: No     Tobacco smoker: No     Systolic Blood Pressure: 154 mmHg     Is BP treated: Yes     HDL Cholesterol: 54 mg/dL     Total Cholesterol: 197 mg/dL  Sleep apnea Pt states his wife states he snores and has apneic episodes at night. He is interested in a home sleep study  GERD Controlled with Omeprazole  Relevant past medical, surgical, family and social history reviewed and updated as indicated. Interim medical history since our last visit reviewed. Allergies and medications reviewed and updated.  Review of Systems  Genitourinary:       Pain in right side comes and goes.  [place] was normal    Per HPI unless specifically indicated above     Objective:    BP (!) 154/93 (BP Location: Left Arm, Patient Position: Sitting)   Pulse 78   Temp 98.5 F (36.9 C) (Oral)   Ht 6' (1.829 m)   Wt 214 lb (97.1 kg)   SpO2 98%   BMI 29.02 kg/m   Wt Readings from Last 3 Encounters:    08/21/20 214 lb (97.1 kg)  06/03/20 220 lb (99.8 kg)  01/28/20 221 lb (100.2 kg)    Physical Exam Constitutional:      General: He is not in acute distress.    Appearance: Normal appearance. He is well-developed.  HENT:     Head: Normocephalic and atraumatic.  Eyes:     General: Lids are normal. No scleral icterus.       Right eye: No discharge.        Left eye: No discharge.     Conjunctiva/sclera: Conjunctivae normal.  Neck:     Vascular: No carotid bruit or JVD.  Cardiovascular:     Rate and Rhythm: Normal rate and regular rhythm.     Heart sounds: Normal heart sounds.  Pulmonary:     Effort: Pulmonary effort is normal. No respiratory distress.     Breath sounds: Normal breath sounds.  Abdominal:     Palpations: There is no hepatomegaly or splenomegaly.  Musculoskeletal:        General: Normal range of motion.     Cervical back:  Normal range of motion and neck supple.  Skin:    General: Skin is warm and dry.     Coloration: Skin is not pale.     Findings: No rash.  Neurological:     Mental Status: He is alert and oriented to person, place, and time.  Psychiatric:        Behavior: Behavior normal.        Thought Content: Thought content normal.        Judgment: Judgment normal.     Results for orders placed or performed during the hospital encounter of 05/30/20  SARS CORONAVIRUS 2 (TAT 6-24 HRS) Nasopharyngeal Nasopharyngeal Swab   Specimen: Nasopharyngeal Swab  Result Value Ref Range   SARS Coronavirus 2 NEGATIVE NEGATIVE      Assessment & Plan:   Problem List Items Addressed This Visit      Unprioritized   Acid reflux    Stable, continue present medications.        Hyperlipidemia    Tolerating statin.  Continue present dose      Hypertension    BP not to goal but good numbers at home.  Will get sleep studey       Other Visit Diagnoses    Obstructive sleep apnea syndrome    -  Primary   Noted apneic episoedes by wife.  Will get home sleep  study   Relevant Orders   Ambulatory referral to Sleep Studies       Follow up plan: Return in about 6 months (around 02/19/2021).

## 2020-08-21 NOTE — Assessment & Plan Note (Signed)
Stable, continue present medications.   

## 2020-08-21 NOTE — Assessment & Plan Note (Signed)
BP not to goal but good numbers at home.  Will get sleep studey

## 2020-08-21 NOTE — Assessment & Plan Note (Signed)
Tolerating statin.  Continue present dose

## 2020-08-23 ENCOUNTER — Other Ambulatory Visit: Payer: Self-pay | Admitting: Family Medicine

## 2020-08-23 DIAGNOSIS — I10 Essential (primary) hypertension: Secondary | ICD-10-CM

## 2020-08-23 NOTE — Telephone Encounter (Signed)
Requested Prescriptions  Pending Prescriptions Disp Refills  . hydrochlorothiazide (HYDRODIURIL) 25 MG tablet [Pharmacy Med Name: HYDROCHLOROTHIAZIDE 25 MG TAB] 90 tablet 0    Sig: TAKE 1 TABLET BY MOUTH EVERY DAY     Cardiovascular: Diuretics - Thiazide Failed - 08/23/2020  8:54 AM      Failed - Last BP in normal range    BP Readings from Last 1 Encounters:  08/21/20 (!) 154/93         Passed - Ca in normal range and within 360 days    Calcium  Date Value Ref Range Status  01/28/2020 9.5 8.7 - 10.2 mg/dL Final         Passed - Cr in normal range and within 360 days    Creatinine, Ser  Date Value Ref Range Status  01/28/2020 0.83 0.76 - 1.27 mg/dL Final         Passed - K in normal range and within 360 days    Potassium  Date Value Ref Range Status  01/28/2020 4.1 3.5 - 5.2 mmol/L Final         Passed - Na in normal range and within 360 days    Sodium  Date Value Ref Range Status  01/28/2020 138 134 - 144 mmol/L Final         Passed - Valid encounter within last 6 months    Recent Outpatient Visits          2 days ago Obstructive sleep apnea syndrome   [facility] Gabriel Cirri, NP   6 months ago Essential hypertension   [facility] Particia Nearing, [place]   1 year ago Right upper quadrant abdominal mass   [facility], Salley Hews, [place]   1 year ago Essential hypertension   [facility] Crissman, Redge Gainer, MD   1 year ago Essential hypertension   [facility] Crissman, Redge Gainer, MD

## 2020-09-01 ENCOUNTER — Encounter: Payer: Self-pay | Admitting: Unknown Physician Specialty

## 2020-09-10 ENCOUNTER — Other Ambulatory Visit: Payer: Self-pay | Admitting: Family Medicine

## 2020-09-10 DIAGNOSIS — I10 Essential (primary) hypertension: Secondary | ICD-10-CM

## 2020-09-10 NOTE — Telephone Encounter (Signed)
Requested Prescriptions  Pending Prescriptions Disp Refills  . amLODipine (NORVASC) 5 MG tablet [Pharmacy Med Name: AMLODIPINE BESYLATE 5 MG TAB] 90 tablet 1    Sig: TAKE 1 TABLET BY MOUTH EVERY DAY     Cardiovascular:  Calcium Channel Blockers Failed - 09/10/2020  1:45 AM      Failed - Last BP in normal range    BP Readings from Last 1 Encounters:  08/21/20 (!) 154/93         Passed - Valid encounter within last 6 months    Recent Outpatient Visits          2 weeks ago Obstructive sleep apnea syndrome   [facility] Gabriel Cirri, NP   7 months ago Essential hypertension   [facility] Roosvelt Maser [place], [place]   1 year ago Right upper quadrant abdominal mass   [facility], Salley Hews, [place]   1 year ago Essential hypertension   [facility] Crissman, Redge Gainer, MD   1 year ago Essential hypertension   [facility] Crissman, Redge Gainer, MD

## 2020-09-21 ENCOUNTER — Other Ambulatory Visit: Payer: Self-pay | Admitting: Family Medicine

## 2020-09-21 DIAGNOSIS — I10 Essential (primary) hypertension: Secondary | ICD-10-CM

## 2020-09-21 DIAGNOSIS — E785 Hyperlipidemia, unspecified: Secondary | ICD-10-CM

## 2020-10-01 NOTE — Telephone Encounter (Signed)
Referral coordinator called and spoke with patient.

## 2020-10-26 ENCOUNTER — Encounter: Payer: Self-pay | Admitting: Unknown Physician Specialty

## 2020-11-13 ENCOUNTER — Ambulatory Visit: Payer: BC Managed Care – PPO | Attending: Otolaryngology

## 2020-11-13 DIAGNOSIS — G4731 Primary central sleep apnea: Secondary | ICD-10-CM | POA: Insufficient documentation

## 2020-11-20 ENCOUNTER — Ambulatory Visit: Payer: BC Managed Care – PPO | Admitting: Unknown Physician Specialty

## 2020-11-20 ENCOUNTER — Other Ambulatory Visit: Payer: Self-pay

## 2020-11-20 ENCOUNTER — Encounter: Payer: Self-pay | Admitting: Unknown Physician Specialty

## 2020-11-20 VITALS — BP 148/89 | HR 87 | Temp 98.3°F | Wt 218.0 lb

## 2020-11-20 DIAGNOSIS — I10 Essential (primary) hypertension: Secondary | ICD-10-CM

## 2020-11-20 DIAGNOSIS — F320 Major depressive disorder, single episode, mild: Secondary | ICD-10-CM

## 2020-11-20 DIAGNOSIS — Z23 Encounter for immunization: Secondary | ICD-10-CM | POA: Diagnosis not present

## 2020-11-20 NOTE — Assessment & Plan Note (Signed)
Good numbers at home.  Asked to write his results down.  Will f/u after tx for sleep apnea if necessary.

## 2020-11-20 NOTE — Progress Notes (Signed)
BP (!) 148/89   Pulse 87   Temp 98.3 F (36.8 C)   Wt 218 lb (98.9 kg)   SpO2 97%   BMI 29.57 kg/m    Subjective:    Patient ID: David Barrera, male    DOB: 04-Jul-1966, 55 y.o.   MRN: [id number]  HPI: David Barrera is a 55 y.o. male  Chief Complaint  Patient presents with  . Depression   Pt states he has had a lot of anxiety since losing a job several years ago.  States he feels down some, gets tired a lot (just got a sleep study), worries.  Just sent a sleep study back but doesn't have results  Depression screen [facility] 2/9 11/20/2020 08/21/2020 01/28/2020 08/23/2019 09/25/2018  Decreased Interest 0 0 0 0 1  Down, Depressed, Hopeless 2 0 0 0 1  PHQ - 2 Score 2 0 0 0 2  Altered sleeping 0 0 0 - 0  Tired, decreased energy 2 0 3 - -  Change in appetite 0 0 0 - -  Feeling bad or failure about yourself  0 0 0 - -  Trouble concentrating 0 0 0 - -  Moving slowly or fidgety/restless 0 0 0 - -  Suicidal thoughts 0 0 0 - -  PHQ-9 Score 4 0 3 - 2  Difficult doing work/chores Somewhat difficult Not difficult at all - - -   Hypertension Using medications without difficulty Continues high today but good numbers at home   No problems or lightheadedness No chest pain with exertion or shortness of breath No Edema   Relevant past medical, surgical, family and social history reviewed and updated as indicated. Interim medical history since our last visit reviewed. Allergies and medications reviewed and updated.  Review of Systems  Per HPI unless specifically indicated above     Objective:    BP (!) 148/89   Pulse 87   Temp 98.3 F (36.8 C)   Wt 218 lb (98.9 kg)   SpO2 97%   BMI 29.57 kg/m   Wt Readings from Last 3 Encounters:  11/20/20 218 lb (98.9 kg)  08/21/20 214 lb (97.1 kg)  06/03/20 220 lb (99.8 kg)    Physical Exam Constitutional:      General: He is not in acute distress.    Appearance: Normal appearance. He is well-developed and well-nourished.  HENT:      Head: Normocephalic and atraumatic.  Eyes:     General: Lids are normal. No scleral icterus.       Right eye: No discharge.        Left eye: No discharge.     Conjunctiva/sclera: Conjunctivae normal.  Cardiovascular:     Rate and Rhythm: Normal rate and regular rhythm.  Pulmonary:     Effort: Pulmonary effort is normal.  Abdominal:     Palpations: There is no hepatomegaly or splenomegaly.  Musculoskeletal:        General: Normal range of motion.     Cervical back: Normal range of motion.  Skin:    General: Skin is intact.     Coloration: Skin is not pale.     Findings: No rash.  Neurological:     Mental Status: He is alert and oriented to person, place, and time.  Psychiatric:        Mood and Affect: Mood and affect normal.        Behavior: Behavior normal.        Thought Content:  Thought content normal.        Judgment: Judgment normal.      Assessment & Plan:   Problem List Items Addressed This Visit      Unprioritized   Hypertension    Good numbers at home.  Asked to write his results down.  Will f/u after tx for sleep apnea if necessary.         Other Visit Diagnoses    Immunization due    -  Primary   Relevant Orders   Flu Vaccine QUAD 6+ mos PF IM (Fluarix Quad PF) (Completed)   Depression, major, single episode, mild (HCC)       PHQ is a 4.  However, sleep study is not back yet.  Will await the results and follow-up with mood after treatment if necessary       Follow up plan: Return in about 4 weeks (around 12/18/2020).

## 2020-11-22 ENCOUNTER — Other Ambulatory Visit: Payer: Self-pay | Admitting: Family Medicine

## 2020-11-22 DIAGNOSIS — I10 Essential (primary) hypertension: Secondary | ICD-10-CM

## 2020-11-23 ENCOUNTER — Other Ambulatory Visit: Payer: Self-pay | Admitting: Family Medicine

## 2020-11-23 DIAGNOSIS — K219 Gastro-esophageal reflux disease without esophagitis: Secondary | ICD-10-CM

## 2020-11-30 ENCOUNTER — Other Ambulatory Visit: Payer: Self-pay | Admitting: Family Medicine

## 2020-11-30 DIAGNOSIS — M1A00X0 Idiopathic chronic gout, unspecified site, without tophus (tophi): Secondary | ICD-10-CM

## 2020-11-30 NOTE — Telephone Encounter (Signed)
Requested medication (s) are due for refill today: yes  Requested medication (s) are on the active medication list: yes  Last refill:  06/02/20  Future visit scheduled: yes  Notes to clinic:  overdue lab work   Requested Prescriptions  Pending Prescriptions Disp Refills   allopurinol (ZYLOPRIM) 100 MG tablet [Pharmacy Med Name: ALLOPURINOL 100 MG TABLET] 30 tablet 5    Sig: TAKE 1 TABLET BY MOUTH EVERY DAY      Endocrinology:  Gout Agents Failed - 11/30/2020 12:53 PM      Failed - Uric Acid in normal range and within 360 days    Uric Acid  Date Value Ref Range Status  09/25/2018 5.1 3.7 - 8.6 mg/dL Final    Comment:               Therapeutic target for gout patients: <6.0          Passed - Cr in normal range and within 360 days    Creatinine, Ser  Date Value Ref Range Status  01/28/2020 0.83 0.76 - 1.27 mg/dL Final          Passed - Valid encounter within last 12 months    Recent Outpatient Visits           1 week ago Immunization due   [facility] Gabriel Cirri, NP   3 months ago Obstructive sleep apnea syndrome   [facility] Gabriel Cirri, NP   10 months ago Essential hypertension   [facility] Roosvelt Maser [place], [place]   1 year ago Right upper quadrant abdominal mass   [facility], Salley Hews, [place]   1 year ago Essential hypertension   [facility] [place], Redge Gainer, MD       Future Appointments             In 3 weeks Larae Grooms, NP [facility]

## 2020-12-15 ENCOUNTER — Encounter: Payer: Self-pay | Admitting: Unknown Physician Specialty

## 2020-12-22 ENCOUNTER — Other Ambulatory Visit: Payer: Self-pay | Admitting: Unknown Physician Specialty

## 2020-12-22 DIAGNOSIS — I10 Essential (primary) hypertension: Secondary | ICD-10-CM

## 2020-12-23 NOTE — Progress Notes (Deleted)
   There were no vitals taken for this visit.   Subjective:    Patient ID: David Barrera, male    DOB: 30-Jun-1966, 55 y.o.   MRN: [id number]  HPI: David Barrera is a 55 y.o. male  No chief complaint on file.  HYPERTENSION / HYPERLIPIDEMIA Satisfied with current treatment? {Blank single:19197::"yes","no"} Duration of hypertension: {Blank single:19197::"chronic","months","years"} BP monitoring frequency: {Blank single:19197::"not checking","rarely","daily","weekly","monthly","a few times a day","a few times a week","a few times a month"} BP range:  BP medication side effects: {Blank single:19197::"yes","no"} Past BP meds: {Blank multiple:19196::"none","amlodipine","amlodipine/benazepril","atenolol","benazepril","benazepril/HCTZ","bisoprolol (bystolic)","carvedilol","chlorthalidone","clonidine","diltiazem","exforge HCT","HCTZ","irbesartan (avapro)","labetalol","lisinopril","lisinopril-HCTZ","losartan (cozaar)","methyldopa","nifedipine","olmesartan (benicar)","olmesartan-HCTZ","quinapril","ramipril","spironalactone","tekturna","valsartan","valsartan-HCTZ","verapamil"} Duration of hyperlipidemia: {Blank single:19197::"chronic","months","years"} Cholesterol medication side effects: {Blank single:19197::"yes","no"} Cholesterol supplements: {Blank multiple:19196::"none","fish oil","niacin","red yeast rice"} Past cholesterol medications: {Blank multiple:19196::"none","atorvastain (lipitor)","lovastatin (mevacor)","pravastatin (pravachol)","rosuvastatin (crestor)","simvastatin (zocor)","vytorin","fenofibrate (tricor)","gemfibrozil","ezetimide (zetia)","niaspan","lovaza"} Medication compliance: {Blank single:19197::"excellent compliance","good compliance","fair compliance","poor compliance"} Aspirin: {Blank single:19197::"yes","no"} Recent stressors: {Blank single:19197::"yes","no"} Recurrent headaches: {Blank single:19197::"yes","no"} Visual changes: {Blank single:19197::"yes","no"} Palpitations:  {Blank single:19197::"yes","no"} Dyspnea: {Blank single:19197::"yes","no"} Chest pain: {Blank single:19197::"yes","no"} Lower extremity edema: {Blank single:19197::"yes","no"} Dizzy/lightheaded: {Blank single:19197::"yes","no"}  GERD GERD control status: {Blank single:19197::"controlled","uncontrolled","better","worse","exacerbated","stable"}Satisfied with current treatment? {Blank single:19197::"yes","no"} Heartburn frequency:  Medication side effects: {Blank single:19197::"yes","no"}  Medication compliance: {Blank multiple:19196::"better","worse","stable","fluctuating"} Previous GERD medications: Antacid use frequency:   Duration:  Nature:  Location:  Heartburn duration:  Alleviatiating factors:   Aggravating factors:  Dysphagia: {Blank single:19197::"yes","no"} Odynophagia:  {Blank single:19197::"yes","no"} Hematemesis: {Blank single:19197::"yes","no"} Blood in stool: {Blank single:19197::"yes","no"} EGD: {Blank single:19197::"yes","no"}  SLEEP STUDY  Relevant past medical, surgical, family and social history reviewed and updated as indicated. Interim medical history since our last visit reviewed. Allergies and medications reviewed and updated.  Review of Systems  Per HPI unless specifically indicated above     Objective:    There were no vitals taken for this visit.  Wt Readings from Last 3 Encounters:  11/20/20 218 lb (98.9 kg)  08/21/20 214 lb (97.1 kg)  06/03/20 220 lb (99.8 kg)    Physical Exam  Results for orders placed or performed during the hospital encounter of 05/30/20  SARS CORONAVIRUS 2 (TAT 6-24 HRS) Nasopharyngeal Nasopharyngeal Swab   Specimen: Nasopharyngeal Swab  Result Value Ref Range   SARS Coronavirus 2 NEGATIVE NEGATIVE      Assessment & Plan:   Problem List Items Addressed This Visit      Cardiovascular and Mediastinum   Hypertension - Primary       Follow up plan: No follow-ups on file.

## 2020-12-24 ENCOUNTER — Other Ambulatory Visit: Payer: Self-pay

## 2020-12-24 ENCOUNTER — Ambulatory Visit: Payer: BC Managed Care – PPO | Admitting: Family Medicine

## 2020-12-24 ENCOUNTER — Ambulatory Visit: Payer: BC Managed Care – PPO | Admitting: Nurse Practitioner

## 2020-12-24 ENCOUNTER — Encounter: Payer: Self-pay | Admitting: Family Medicine

## 2020-12-24 VITALS — BP 136/89 | HR 109 | Temp 99.2°F | Wt 220.0 lb

## 2020-12-24 DIAGNOSIS — E785 Hyperlipidemia, unspecified: Secondary | ICD-10-CM | POA: Diagnosis not present

## 2020-12-24 DIAGNOSIS — M1A00X0 Idiopathic chronic gout, unspecified site, without tophus (tophi): Secondary | ICD-10-CM

## 2020-12-24 DIAGNOSIS — I10 Essential (primary) hypertension: Secondary | ICD-10-CM | POA: Diagnosis not present

## 2020-12-24 DIAGNOSIS — G4733 Obstructive sleep apnea (adult) (pediatric): Secondary | ICD-10-CM

## 2020-12-24 DIAGNOSIS — K219 Gastro-esophageal reflux disease without esophagitis: Secondary | ICD-10-CM | POA: Diagnosis not present

## 2020-12-24 LAB — MICROALBUMIN, URINE WAIVED
Creatinine, Urine Waived: 100 mg/dL (ref 10–300)
Microalb, Ur Waived: 10 mg/L (ref 0–19)
Microalb/Creat Ratio: <30 mg/g (ref <30)

## 2020-12-24 MED ORDER — METOPROLOL SUCCINATE ER 100 MG PO TB24: 100.0000 mg | ORAL_TABLET | Freq: Every day | ORAL | 0 refills | Status: DC

## 2020-12-24 MED ORDER — ALLOPURINOL 100 MG PO TABS: 100.0000 mg | ORAL_TABLET | Freq: Every day | ORAL | 0 refills | Status: DC

## 2020-12-24 MED ORDER — ROSUVASTATIN CALCIUM 20 MG PO TABS: 20.0000 mg | ORAL_TABLET | Freq: Every day | ORAL | 0 refills | Status: DC

## 2020-12-24 MED ORDER — AMLODIPINE BESYLATE 5 MG PO TABS: 5.0000 mg | ORAL_TABLET | Freq: Every day | ORAL | 0 refills | Status: DC

## 2020-12-24 MED ORDER — SILDENAFIL CITRATE 20 MG PO TABS: ORAL_TABLET | ORAL | 3 refills | Status: DC

## 2020-12-24 MED ORDER — FENOFIBRIC ACID 135 MG PO CPDR: 135.0000 mg | DELAYED_RELEASE_CAPSULE | Freq: Every day | ORAL | 0 refills | Status: DC

## 2020-12-24 MED ORDER — HYDROCHLOROTHIAZIDE 25 MG PO TABS: 25.0000 mg | ORAL_TABLET | Freq: Every day | ORAL | 0 refills | Status: DC

## 2020-12-24 MED ORDER — OMEPRAZOLE 20 MG PO CPDR: DELAYED_RELEASE_CAPSULE | ORAL | 0 refills | Status: DC

## 2020-12-24 NOTE — Assessment & Plan Note (Signed)
Sleep study shows OSA. They recommend auto-titrating CPAP. Will get started for him. Call with any concerns or if not feeling better.

## 2020-12-24 NOTE — Progress Notes (Signed)
BP 136/89   Pulse (!) 109   Temp 99.2 F (37.3 C)   Wt 220 lb (99.8 kg)   SpO2 98%   BMI 29.84 kg/m    Subjective:    Patient ID: David Barrera, male    DOB: 08/26/1966, 55 y.o.   MRN: [id number]  HPI: David Barrera is a 55 y.o. male  Chief Complaint  Patient presents with  . Hypertension  . Depression  . sleep study    Patient would like results to sleep study   HYPERTENSION / HYPERLIPIDEMIA Satisfied with current treatment? yes Duration of hypertension: chronic BP monitoring frequency: a few times a month BP range: 120s-130s/80s BP medication side effects: no Past BP meds: metoprolol, hctz, amlodipine Duration of hyperlipidemia: chronic Cholesterol medication side effects: no Cholesterol supplements: none Past cholesterol medications:  Medication compliance: crestor, fenofibrate Aspirin: yes Recent stressors: no Recurrent headaches: no Visual changes: no Palpitations: no Dyspnea: no Chest pain: no Lower extremity edema: no Dizzy/lightheaded: no  DEPRESSION Mood status: uncontrolled Satisfied with current treatment?: no Symptom severity: mild  Duration of current treatment : not on anything Psychotherapy/counseling: no  Previous psychiatric medications: none Depressed mood: yes Anxious mood: yes Anhedonia: no Significant weight loss or gain: no Insomnia: no  Fatigue: yes Feelings of worthlessness or guilt: no Impaired concentration/indecisiveness: no Suicidal ideations: no Hopelessness: no Crying spells: no Depression screen [facility] 2/9 12/24/2020 11/20/2020 08/21/2020 01/28/2020 08/23/2019  Decreased Interest 1 0 0 0 0  Down, Depressed, Hopeless 1 2 0 0 0  PHQ - 2 Score 2 2 0 0 0  Altered sleeping 0 0 0 0 -  Tired, decreased energy 1 2 0 3 -  Change in appetite 0 0 0 0 -  Feeling bad or failure about yourself  0 0 0 0 -  Trouble concentrating 0 0 0 0 -  Moving slowly or fidgety/restless 0 0 0 0 -  Suicidal thoughts 0 0 0 0 -  PHQ-9 Score 3 4 0 3  -  Difficult doing work/chores Not difficult at all Somewhat difficult Not difficult at all - -   SLEEP APNEA Sleep apnea status: uncontrolled Duration: months Satisfied with current treatment?:  no - has not been able to get his CPAP CPAP use:  no Sleep quality with CPAP use: N/A Last sleep study: December 2021 Treatments attempted: none Wakes feeling refreshed:  no Daytime hypersomnolence:  yes Fatigue:  yes Insomnia:  no Good sleep hygiene:  yes Difficulty falling asleep:  no Difficulty staying asleep:  no Snoring bothers bed partner:  yes Observed apnea by bed partner: yes Obesity:  yes Hypertension: yes  Pulmonary hypertension:  no Coronary artery disease:  no  Relevant past medical, surgical, family and social history reviewed and updated as indicated. Interim medical history since our last visit reviewed. Allergies and medications reviewed and updated.  Review of Systems  Constitutional: Negative.   Respiratory: Negative.   Cardiovascular: Negative.   Gastrointestinal: Negative.   Musculoskeletal: Negative.   Neurological: Negative.   Psychiatric/Behavioral: Negative.     Per HPI unless specifically indicated above     Objective:    BP 136/89   Pulse (!) 109   Temp 99.2 F (37.3 C)   Wt 220 lb (99.8 kg)   SpO2 98%   BMI 29.84 kg/m   Wt Readings from Last 3 Encounters:  12/24/20 220 lb (99.8 kg)  11/20/20 218 lb (98.9 kg)  08/21/20 214 lb (97.1 kg)    Physical  Exam Vitals and nursing note reviewed.  Constitutional:      General: He is not in acute distress.    Appearance: Normal appearance. He is not ill-appearing, toxic-appearing or diaphoretic.  HENT:     Head: Normocephalic and atraumatic.     Right Ear: External ear normal.     Left Ear: External ear normal.     Nose: Nose normal.     Mouth/Throat:     Mouth: Mucous membranes are moist.     Pharynx: Oropharynx is clear.  Eyes:     General: No scleral icterus.       Right eye: No  discharge.        Left eye: No discharge.     Extraocular Movements: Extraocular movements intact.     Conjunctiva/sclera: Conjunctivae normal.     Pupils: Pupils are equal, round, and reactive to light.  Cardiovascular:     Rate and Rhythm: Normal rate and regular rhythm.     Pulses: Normal pulses.     Heart sounds: Normal heart sounds. No murmur heard. No friction rub. No gallop.   Pulmonary:     Effort: Pulmonary effort is normal. No respiratory distress.     Breath sounds: Normal breath sounds. No stridor. No wheezing, rhonchi or rales.  Chest:     Chest wall: No tenderness.  Musculoskeletal:        General: Normal range of motion.     Cervical back: Normal range of motion and neck supple.  Skin:    General: Skin is warm and dry.     Capillary Refill: Capillary refill takes less than 2 seconds.     Coloration: Skin is not jaundiced or pale.     Findings: No bruising, erythema, lesion or rash.  Neurological:     General: No focal deficit present.     Mental Status: He is alert and oriented to person, place, and time. Mental status is at baseline.  Psychiatric:        Mood and Affect: Mood normal.        Behavior: Behavior normal.        Thought Content: Thought content normal.        Judgment: Judgment normal.     Results for orders placed or performed in visit on 12/24/20  CBC with Differential/Platelet  Result Value Ref Range   WBC 4.0 3.4 - 10.8 x10E3/uL   RBC 4.54 4.14 - 5.80 x10E6/uL   Hemoglobin 13.8 13.0 - 17.7 g/dL   Hematocrit 16.141.2 09.637.5 - 51.0 %   MCV 91 79 - 97 fL   MCH 30.4 26.6 - 33.0 pg   MCHC 33.5 31.5 - 35.7 g/dL   RDW 04.512.8 40.911.6 - 81.115.4 %   Platelets 288 150 - 450 x10E3/uL   Neutrophils 46 Not Estab. %   Lymphs 39 Not Estab. %   Monocytes 10 Not Estab. %   Eos 3 Not Estab. %   Basos 1 Not Estab. %   Neutrophils Absolute 1.9 1.4 - 7.0 x10E3/uL   Lymphocytes Absolute 1.6 0.7 - 3.1 x10E3/uL   Monocytes Absolute 0.4 0.1 - 0.9 x10E3/uL   EOS  (ABSOLUTE) 0.1 0.0 - 0.4 x10E3/uL   Basophils Absolute 0.1 0.0 - 0.2 x10E3/uL   Immature Granulocytes 1 Not Estab. %   Immature Grans (Abs) 0.0 0.0 - 0.1 x10E3/uL  Comprehensive metabolic panel  Result Value Ref Range   Glucose 102 (H) 65 - 99 mg/dL   BUN 23 6 - 24  mg/dL   Creatinine, Ser 4.56 0.76 - 1.27 mg/dL   GFR calc non Af Amer 96 >59 mL/min/1.73   GFR calc Af Amer 112 >59 mL/min/1.73   BUN/Creatinine Ratio 26 (H) 9 - 20   Sodium 140 134 - 144 mmol/L   Potassium 4.1 3.5 - 5.2 mmol/L   Chloride 99 96 - 106 mmol/L   CO2 22 20 - 29 mmol/L   Calcium 10.1 8.7 - 10.2 mg/dL   Total Protein 7.5 6.0 - 8.5 g/dL   Albumin 4.9 3.8 - 4.9 g/dL   Globulin, Total 2.6 1.5 - 4.5 g/dL   Albumin/Globulin Ratio 1.9 1.2 - 2.2   Bilirubin Total 0.3 0.0 - 1.2 mg/dL   Alkaline Phosphatase 45 44 - 121 IU/L   AST 25 0 - 40 IU/L   ALT 22 0 - 44 IU/L  Lipid Panel w/o Chol/HDL Ratio  Result Value Ref Range   Cholesterol, Total 214 (H) 100 - 199 mg/dL   Triglycerides 256 (H) 0 - 149 mg/dL   HDL 43 >38 mg/dL   VLDL Cholesterol Cal 83 (H) 5 - 40 mg/dL   LDL Chol Calc (NIH) 88 0 - 99 mg/dL  Microalbumin, Urine Waived  Result Value Ref Range   Microalb, Ur Waived 10 0 - 19 mg/L   Creatinine, Urine Waived 100 10 - 300 mg/dL   Microalb/Creat Ratio <30 <30 mg/g  Uric acid  Result Value Ref Range   Uric Acid 4.9 3.8 - 8.4 mg/dL      Assessment & Plan:   Problem List Items Addressed This Visit      Cardiovascular and Mediastinum   Hypertension - Primary    Running well at home. Running a little high here. Continue current regimen. Continue to monitor. Recheck 6 months. Call with any concerns. Will treat OSA.       Relevant Medications   amLODipine (NORVASC) 5 MG tablet   Choline Fenofibrate (FENOFIBRIC ACID) 135 MG CPDR   hydrochlorothiazide (HYDRODIURIL) 25 MG tablet   metoprolol succinate (TOPROL-XL) 100 MG 24 hr tablet   rosuvastatin (CRESTOR) 20 MG tablet   sildenafil (REVATIO) 20 MG  tablet   Other Relevant Orders   CBC with Differential/Platelet (Completed)   Comprehensive metabolic panel (Completed)   Microalbumin, Urine Waived (Completed)     Respiratory   OSA (obstructive sleep apnea)    Sleep study shows OSA. They recommend auto-titrating CPAP. Will get started for him. Call with any concerns or if not feeling better.         Digestive   Acid reflux    Under good control on current regimen. Continue current regimen. Continue to monitor. Call with any concerns. Refills given.        Relevant Medications   omeprazole (PRILOSEC) 20 MG capsule     Other   Hyperlipidemia    Under good control on current regimen. Continue current regimen. Continue to monitor. Call with any concerns. Refills given. Labs drawn today.        Relevant Medications   amLODipine (NORVASC) 5 MG tablet   Choline Fenofibrate (FENOFIBRIC ACID) 135 MG CPDR   hydrochlorothiazide (HYDRODIURIL) 25 MG tablet   metoprolol succinate (TOPROL-XL) 100 MG 24 hr tablet   rosuvastatin (CRESTOR) 20 MG tablet   sildenafil (REVATIO) 20 MG tablet   Other Relevant Orders   CBC with Differential/Platelet (Completed)   Comprehensive metabolic panel (Completed)   Lipid Panel w/o Chol/HDL Ratio (Completed)   Gout    Under good  control on current regimen. Continue current regimen. Continue to monitor. Call with any concerns. Refills given. Labs drawn today.       Relevant Medications   allopurinol (ZYLOPRIM) 100 MG tablet   Other Relevant Orders   CBC with Differential/Platelet (Completed)   Comprehensive metabolic panel (Completed)   Uric acid (Completed)    Other Visit Diagnoses    Essential hypertension       Relevant Medications   amLODipine (NORVASC) 5 MG tablet   Choline Fenofibrate (FENOFIBRIC ACID) 135 MG CPDR   hydrochlorothiazide (HYDRODIURIL) 25 MG tablet   metoprolol succinate (TOPROL-XL) 100 MG 24 hr tablet   rosuvastatin (CRESTOR) 20 MG tablet   sildenafil (REVATIO) 20 MG  tablet   Gastroesophageal reflux disease       Relevant Medications   omeprazole (PRILOSEC) 20 MG capsule       Follow up plan: Return in about 6 months (around 06/23/2021) for Physical with Clydie Braun.

## 2020-12-24 NOTE — Assessment & Plan Note (Signed)
Under good control on current regimen. Continue current regimen. Continue to monitor. Call with any concerns. Refills given. Labs drawn today.   

## 2020-12-24 NOTE — Assessment & Plan Note (Signed)
Running well at home. Running a little high here. Continue current regimen. Continue to monitor. Recheck 6 months. Call with any concerns. Will treat OSA.

## 2020-12-25 LAB — LIPID PANEL W/O CHOL/HDL RATIO
Cholesterol, Total: 214 mg/dL — ABNORMAL HIGH (ref 100–199)
HDL: 43 mg/dL (ref >39)
LDL Chol Calc (NIH): 88 mg/dL (ref 0–99)
Triglycerides: 506 mg/dL — ABNORMAL HIGH (ref 0–149)
VLDL Cholesterol Cal: 83 mg/dL — ABNORMAL HIGH (ref 5–40)

## 2020-12-25 LAB — COMPREHENSIVE METABOLIC PANEL
ALT: 22 IU/L (ref 0–44)
AST: 25 IU/L (ref 0–40)
Albumin/Globulin Ratio: 1.9 (ref 1.2–2.2)
Albumin: 4.9 g/dL (ref 3.8–4.9)
Alkaline Phosphatase: 45 IU/L (ref 44–121)
BUN/Creatinine Ratio: 26 — ABNORMAL HIGH (ref 9–20)
BUN: 23 mg/dL (ref 6–24)
Bilirubin Total: 0.3 mg/dL (ref 0.0–1.2)
CO2: 22 mmol/L (ref 20–29)
Calcium: 10.1 mg/dL (ref 8.7–10.2)
Chloride: 99 mmol/L (ref 96–106)
Creatinine, Ser: 0.87 mg/dL (ref 0.76–1.27)
GFR calc Af Amer: 112 mL/min/{1.73_m2} (ref >59)
GFR calc non Af Amer: 96 mL/min/{1.73_m2} (ref >59)
Globulin, Total: 2.6 g/dL (ref 1.5–4.5)
Glucose: 102 mg/dL — ABNORMAL HIGH (ref 65–99)
Potassium: 4.1 mmol/L (ref 3.5–5.2)
Sodium: 140 mmol/L (ref 134–144)
Total Protein: 7.5 g/dL (ref 6.0–8.5)

## 2020-12-25 LAB — CBC WITH DIFFERENTIAL/PLATELET
Basophils Absolute: 0.1 10*3/uL (ref 0.0–0.2)
Basos: 1 %
EOS (ABSOLUTE): 0.1 10*3/uL (ref 0.0–0.4)
Eos: 3 %
Hematocrit: 41.2 % (ref 37.5–51.0)
Hemoglobin: 13.8 g/dL (ref 13.0–17.7)
Immature Grans (Abs): 0 10*3/uL (ref 0.0–0.1)
Immature Granulocytes: 1 %
Lymphocytes Absolute: 1.6 10*3/uL (ref 0.7–3.1)
Lymphs: 39 %
MCH: 30.4 pg (ref 26.6–33.0)
MCHC: 33.5 g/dL (ref 31.5–35.7)
MCV: 91 fL (ref 79–97)
Monocytes Absolute: 0.4 10*3/uL (ref 0.1–0.9)
Monocytes: 10 %
Neutrophils Absolute: 1.9 10*3/uL (ref 1.4–7.0)
Neutrophils: 46 %
Platelets: 288 10*3/uL (ref 150–450)
RBC: 4.54 x10E6/uL (ref 4.14–5.80)
RDW: 12.8 % (ref 11.6–15.4)
WBC: 4 10*3/uL (ref 3.4–10.8)

## 2020-12-25 LAB — URIC ACID: Uric Acid: 4.9 mg/dL (ref 3.8–8.4)

## 2020-12-25 NOTE — Assessment & Plan Note (Signed)
Under good control on current regimen. Continue current regimen. Continue to monitor. Call with any concerns. Refills given.   

## 2021-04-03 ENCOUNTER — Other Ambulatory Visit: Payer: Self-pay | Admitting: Family Medicine

## 2021-04-03 DIAGNOSIS — I10 Essential (primary) hypertension: Secondary | ICD-10-CM

## 2021-04-03 DIAGNOSIS — M1A00X0 Idiopathic chronic gout, unspecified site, without tophus (tophi): Secondary | ICD-10-CM

## 2021-04-13 ENCOUNTER — Other Ambulatory Visit: Payer: Self-pay

## 2021-04-22 ENCOUNTER — Other Ambulatory Visit: Payer: Self-pay | Admitting: Family Medicine

## 2021-04-22 DIAGNOSIS — I10 Essential (primary) hypertension: Secondary | ICD-10-CM

## 2021-04-22 DIAGNOSIS — K219 Gastro-esophageal reflux disease without esophagitis: Secondary | ICD-10-CM

## 2021-04-22 DIAGNOSIS — E785 Hyperlipidemia, unspecified: Secondary | ICD-10-CM

## 2021-04-23 DIAGNOSIS — G4733 Obstructive sleep apnea (adult) (pediatric): Secondary | ICD-10-CM | POA: Diagnosis not present

## 2021-04-23 NOTE — Telephone Encounter (Signed)
Notes to clinic: review for future refill Last refill 03/22/2021 for 90 day    Requested Prescriptions  Pending Prescriptions Disp Refills   metoprolol succinate (TOPROL-XL) 100 MG 24 hr tablet [Pharmacy Med Name: METOPROLOL SUCC ER 100 MG TAB] 90 tablet 0    Sig: TAKE 1 TABLET BY MOUTH DAILY. TAKE WITH OR IMMEDIATELY FOLLOWING A MEAL.      Cardiovascular:  Beta Blockers Passed - 04/22/2021  6:47 PM      Passed - Last BP in normal range    BP Readings from Last 1 Encounters:  12/24/20 136/89          Passed - Last Heart Rate in normal range    Pulse Readings from Last 1 Encounters:  12/24/20 (!) 109          Passed - Valid encounter within last 6 months    Recent Outpatient Visits           4 months ago Primary hypertension   [facility] [place], [place], DO   5 months ago Immunization due   [facility] [place], Elnita Maxwell, NP   8 months ago Obstructive sleep apnea syndrome   [facility] Gabriel Cirri, NP   1 year ago Essential hypertension   [facility] Particia Nearing, [place]   1 year ago Right upper quadrant abdominal mass   [facility], Salley Hews, [place]       Future Appointments             In 2 months Larae Grooms, NP [facility], [facility]               rosuvastatin (CRESTOR) 20 MG tablet [Pharmacy Med Name: ROSUVASTATIN CALCIUM 20 MG TAB] 90 tablet 0    Sig: TAKE 1 TABLET BY MOUTH EVERY DAY      Cardiovascular:  Antilipid - Statins Failed - 04/22/2021  6:47 PM      Failed - Total Cholesterol in normal range and within 360 days    Cholesterol, Total  Date Value Ref Range Status  12/24/2020 214 (H) 100 - 199 mg/dL Final   Cholesterol Piccolo, Waived  Date Value Ref Range Status  04/25/2019 169 <200 mg/dL Final    Comment:                            Desirable                <200                         Borderline High      200- 239                          High                     >239           Failed - Triglycerides in normal range and within 360 days    Triglycerides  Date Value Ref Range Status  12/24/2020 506 (H) 0 - 149 mg/dL Final   Triglycerides Piccolo,Waived  Date Value Ref Range Status  04/25/2019 381 (H) <150 mg/dL Final    Comment:  Normal                   <150                         Borderline High     150 - 199                         High                200 - 499                         Very High                >499           Passed - LDL in normal range and within 360 days    LDL Chol Calc (NIH)  Date Value Ref Range Status  12/24/2020 88 0 - 99 mg/dL Final          Passed - HDL in normal range and within 360 days    HDL  Date Value Ref Range Status  12/24/2020 43 >39 mg/dL Final          Passed - Patient is not pregnant      Passed - Valid encounter within last 12 months    Recent Outpatient Visits           4 months ago Primary hypertension   [facility] [place], Megan P, DO   5 months ago Immunization due   [facility], [place], NP   8 months ago Obstructive sleep apnea syndrome   [facility] Gabriel Cirri, NP   1 year ago Essential hypertension   [facility] Roosvelt Maser [place], [place]   1 year ago Right upper quadrant abdominal mass   [facility], Salley Hews, [place]       Future Appointments             In 2 months Larae Grooms, NP [facility], [facility]               omeprazole (PRILOSEC) 20 MG capsule [Pharmacy Med Name: OMEPRAZOLE DR 20 MG CAPSULE] 90 capsule 0    Sig: TAKE 1 CAPSULE BY MOUTH EVERY DAY      Gastroenterology: Proton Pump Inhibitors Passed - 04/22/2021  6:47 PM      Passed - Valid encounter within last 12 months    Recent Outpatient Visits           4 months ago Primary hypertension   [facility] [place], Megan P, DO   5  months ago Immunization due   [facility] [place], Elnita Maxwell, NP   8 months ago Obstructive sleep apnea syndrome   [facility] Gabriel Cirri, NP   1 year ago Essential hypertension   [facility] Roosvelt Maser [place], [place]   1 year ago Right upper quadrant abdominal mass   [facility], Salley Hews, [place]       Future Appointments             In 2 months Larae Grooms, NP [facility], [facility]

## 2021-04-23 NOTE — Telephone Encounter (Signed)
Pt had apt on 12/24/2020 next on 06/29/2021

## 2021-04-29 DIAGNOSIS — G4733 Obstructive sleep apnea (adult) (pediatric): Secondary | ICD-10-CM | POA: Diagnosis not present

## 2021-05-29 DIAGNOSIS — G4733 Obstructive sleep apnea (adult) (pediatric): Secondary | ICD-10-CM | POA: Diagnosis not present

## 2021-05-31 ENCOUNTER — Encounter: Payer: Self-pay | Admitting: Unknown Physician Specialty

## 2021-06-02 NOTE — Telephone Encounter (Signed)
Called lmom for pt to call the office to get an appointment scheduled

## 2021-06-11 ENCOUNTER — Ambulatory Visit: Payer: BC Managed Care – PPO | Admitting: Nurse Practitioner

## 2021-06-11 NOTE — Progress Notes (Deleted)
There were no vitals taken for this visit.   Subjective:    Patient ID: David Barrera, male    DOB: 1966-05-23, 55 y.o.   MRN: [id number]  HPI: David Barrera is a 55 y.o. male  No chief complaint on file.  SWELLING IN FEET  Relevant past medical, surgical, family and social history reviewed and updated as indicated. Interim medical history since our last visit reviewed. Allergies and medications reviewed and updated.  Review of Systems  Per HPI unless specifically indicated above     Objective:    There were no vitals taken for this visit.  Wt Readings from Last 3 Encounters:  12/24/20 220 lb (99.8 kg)  11/20/20 218 lb (98.9 kg)  08/21/20 214 lb (97.1 kg)    Physical Exam  Results for orders placed or performed in visit on 12/24/20  CBC with Differential/Platelet  Result Value Ref Range   WBC 4.0 3.4 - 10.8 x10E3/uL   RBC 4.54 4.14 - 5.80 x10E6/uL   Hemoglobin 13.8 13.0 - 17.7 g/dL   Hematocrit 35.0 09.3 - 51.0 %   MCV 91 79 - 97 fL   MCH 30.4 26.6 - 33.0 pg   MCHC 33.5 31.5 - 35.7 g/dL   RDW 81.8 29.9 - 37.1 %   Platelets 288 150 - 450 x10E3/uL   Neutrophils 46 Not Estab. %   Lymphs 39 Not Estab. %   Monocytes 10 Not Estab. %   Eos 3 Not Estab. %   Basos 1 Not Estab. %   Neutrophils Absolute 1.9 1.4 - 7.0 x10E3/uL   Lymphocytes Absolute 1.6 0.7 - 3.1 x10E3/uL   Monocytes Absolute 0.4 0.1 - 0.9 x10E3/uL   EOS (ABSOLUTE) 0.1 0.0 - 0.4 x10E3/uL   Basophils Absolute 0.1 0.0 - 0.2 x10E3/uL   Immature Granulocytes 1 Not Estab. %   Immature Grans (Abs) 0.0 0.0 - 0.1 x10E3/uL  Comprehensive metabolic panel  Result Value Ref Range   Glucose 102 (H) 65 - 99 mg/dL   BUN 23 6 - 24 mg/dL   Creatinine, Ser 6.96 0.76 - 1.27 mg/dL   GFR calc non Af Amer 96 >59 mL/min/1.73   GFR calc Af Amer 112 >59 mL/min/1.73   BUN/Creatinine Ratio 26 (H) 9 - 20   Sodium 140 134 - 144 mmol/L   Potassium 4.1 3.5 - 5.2 mmol/L   Chloride 99 96 - 106 mmol/L   CO2 22 20 - 29 mmol/L    Calcium 10.1 8.7 - 10.2 mg/dL   Total Protein 7.5 6.0 - 8.5 g/dL   Albumin 4.9 3.8 - 4.9 g/dL   Globulin, Total 2.6 1.5 - 4.5 g/dL   Albumin/Globulin Ratio 1.9 1.2 - 2.2   Bilirubin Total 0.3 0.0 - 1.2 mg/dL   Alkaline Phosphatase 45 44 - 121 IU/L   AST 25 0 - 40 IU/L   ALT 22 0 - 44 IU/L  Lipid Panel w/o Chol/HDL Ratio  Result Value Ref Range   Cholesterol, Total 214 (H) 100 - 199 mg/dL   Triglycerides 789 (H) 0 - 149 mg/dL   HDL 43 >38 mg/dL   VLDL Cholesterol Cal 83 (H) 5 - 40 mg/dL   LDL Chol Calc (NIH) 88 0 - 99 mg/dL  Microalbumin, Urine Waived  Result Value Ref Range   Microalb, Ur Waived 10 0 - 19 mg/L   Creatinine, Urine Waived 100 10 - 300 mg/dL   Microalb/Creat Ratio <30 <30 mg/g  Uric acid  Result Value Ref  Range   Uric Acid 4.9 3.8 - 8.4 mg/dL      Assessment & Plan:   Problem List Items Addressed This Visit   None    Follow up plan: No follow-ups on file.

## 2021-06-26 ENCOUNTER — Other Ambulatory Visit: Payer: Self-pay | Admitting: Nurse Practitioner

## 2021-06-26 DIAGNOSIS — I10 Essential (primary) hypertension: Secondary | ICD-10-CM

## 2021-06-26 DIAGNOSIS — M1A00X0 Idiopathic chronic gout, unspecified site, without tophus (tophi): Secondary | ICD-10-CM

## 2021-06-26 NOTE — Telephone Encounter (Signed)
Requested medications are due for refill today NO  Requested medications are on the active medication list Yes  Last refill 7/30 for 90 days  Last visit 12/24/20  Future visit scheduled No , pt canceled appt and did not reschedule  Notes to clinic requests too early.

## 2021-06-29 ENCOUNTER — Encounter: Payer: BC Managed Care – PPO | Admitting: Nurse Practitioner

## 2021-06-29 DIAGNOSIS — G4733 Obstructive sleep apnea (adult) (pediatric): Secondary | ICD-10-CM | POA: Diagnosis not present

## 2021-06-29 NOTE — Telephone Encounter (Signed)
appt required

## 2021-06-29 NOTE — Telephone Encounter (Signed)
Patient last seen 12/24/20. Had appointment for today but cancelled.

## 2021-07-13 ENCOUNTER — Ambulatory Visit: Payer: BC Managed Care – PPO | Admitting: Nurse Practitioner

## 2021-07-13 ENCOUNTER — Other Ambulatory Visit: Payer: Self-pay

## 2021-07-13 ENCOUNTER — Encounter: Payer: Self-pay | Admitting: Nurse Practitioner

## 2021-07-13 VITALS — BP 124/76 | HR 83 | Temp 98.4°F | Ht 70.6 in | Wt 219.4 lb

## 2021-07-13 DIAGNOSIS — I10 Essential (primary) hypertension: Secondary | ICD-10-CM

## 2021-07-13 DIAGNOSIS — M1A00X0 Idiopathic chronic gout, unspecified site, without tophus (tophi): Secondary | ICD-10-CM

## 2021-07-13 DIAGNOSIS — E785 Hyperlipidemia, unspecified: Secondary | ICD-10-CM | POA: Diagnosis not present

## 2021-07-13 DIAGNOSIS — F339 Major depressive disorder, recurrent, unspecified: Secondary | ICD-10-CM

## 2021-07-13 DIAGNOSIS — F419 Anxiety disorder, unspecified: Secondary | ICD-10-CM

## 2021-07-13 DIAGNOSIS — Z0000 Encounter for general adult medical examination without abnormal findings: Secondary | ICD-10-CM | POA: Diagnosis not present

## 2021-07-13 DIAGNOSIS — Z23 Encounter for immunization: Secondary | ICD-10-CM | POA: Diagnosis not present

## 2021-07-13 LAB — URINALYSIS, ROUTINE W REFLEX MICROSCOPIC
Bilirubin, UA: NEGATIVE
Glucose, UA: NEGATIVE
Ketones, UA: NEGATIVE
Leukocytes,UA: NEGATIVE
Nitrite, UA: NEGATIVE
Protein,UA: NEGATIVE
RBC, UA: NEGATIVE
Specific Gravity, UA: 1.02 (ref 1.005–1.030)
Urobilinogen, Ur: 0.2 mg/dL (ref 0.2–1.0)
pH, UA: 7 (ref 5.0–7.5)

## 2021-07-13 MED ORDER — VENLAFAXINE HCL ER 37.5 MG PO CP24: 37.5000 mg | ORAL_CAPSULE | Freq: Every day | ORAL | 0 refills | Status: DC

## 2021-07-13 MED ORDER — ALLOPURINOL 100 MG PO TABS: 100.0000 mg | ORAL_TABLET | Freq: Every day | ORAL | 1 refills | Status: DC

## 2021-07-13 MED ORDER — FENOFIBRIC ACID 135 MG PO CPDR: 1.0000 | DELAYED_RELEASE_CAPSULE | Freq: Every day | ORAL | 1 refills | Status: DC

## 2021-07-13 MED ORDER — AMLODIPINE BESYLATE 5 MG PO TABS: 5.0000 mg | ORAL_TABLET | Freq: Every day | ORAL | 1 refills | Status: DC

## 2021-07-13 MED ORDER — SILDENAFIL CITRATE 20 MG PO TABS: ORAL_TABLET | ORAL | 3 refills | Status: DC

## 2021-07-13 MED ORDER — HYDROCHLOROTHIAZIDE 25 MG PO TABS: 25.0000 mg | ORAL_TABLET | Freq: Every day | ORAL | 1 refills | Status: DC

## 2021-07-13 NOTE — Assessment & Plan Note (Signed)
Chronic.  Not well controlled. GAD7 at visit today is 14.  Will start Effexor 37.5mg  daily.  Side effects and benefits of medication discussed at visit today.  Follow up in 6 weeks for reevaluation.

## 2021-07-13 NOTE — Assessment & Plan Note (Signed)
Chronic.  Controlled.  Continue with current medication regimen.  Labs ordered today.  Return to clinic in 6 months for reevaluation.  Call sooner if concerns arise.  ? ?

## 2021-07-13 NOTE — Assessment & Plan Note (Signed)
Chronic.  Not well controlled. Phq9 at visit today is 11.  Will start Effexor 37.5mg  daily.  Side effects and benefits of medication discussed at visit today.  Follow up in 6 weeks for reevaluation.

## 2021-07-13 NOTE — Assessment & Plan Note (Signed)
Chronic.  Controlled.  Continue with current medication regimen.  Refills sent.  Labs ordered today.  Return to clinic in 6 months for reevaluation.  Call sooner if concerns arise.

## 2021-07-13 NOTE — Progress Notes (Deleted)
There were no vitals taken for this visit.   Subjective:    Patient ID: David Barrera, male    DOB: October 20, 1966, 55 y.o.   MRN: [id number]  HPI: KEYTON Barrera is a 55 y.o. male  No chief complaint on file.  HYPERTENSION / HYPERLIPIDEMIA Satisfied with current treatment? {Blank single:19197::"yes","no"} Duration of hypertension: {Blank single:19197::"chronic","months","years"} BP monitoring frequency: {Blank single:19197::"not checking","rarely","daily","weekly","monthly","a few times a day","a few times a week","a few times a month"} BP range:  BP medication side effects: {Blank single:19197::"yes","no"} Past BP meds: {Blank multiple:19196::"none","amlodipine","amlodipine/benazepril","atenolol","benazepril","benazepril/HCTZ","bisoprolol (bystolic)","carvedilol","chlorthalidone","clonidine","diltiazem","exforge HCT","HCTZ","irbesartan (avapro)","labetalol","lisinopril","lisinopril-HCTZ","losartan (cozaar)","methyldopa","nifedipine","olmesartan (benicar)","olmesartan-HCTZ","quinapril","ramipril","spironalactone","tekturna","valsartan","valsartan-HCTZ","verapamil"} Duration of hyperlipidemia: {Blank single:19197::"chronic","months","years"} Cholesterol medication side effects: {Blank single:19197::"yes","no"} Cholesterol supplements: {Blank multiple:19196::"none","fish oil","niacin","red yeast rice"} Past cholesterol medications: {Blank multiple:19196::"none","atorvastain (lipitor)","lovastatin (mevacor)","pravastatin (pravachol)","rosuvastatin (crestor)","simvastatin (zocor)","vytorin","fenofibrate (tricor)","gemfibrozil","ezetimide (zetia)","niaspan","lovaza"} Medication compliance: {Blank single:19197::"excellent compliance","good compliance","fair compliance","poor compliance"} Aspirin: {Blank single:19197::"yes","no"} Recent stressors: {Blank single:19197::"yes","no"} Recurrent headaches: {Blank single:19197::"yes","no"} Visual changes: {Blank single:19197::"yes","no"} Palpitations:  {Blank single:19197::"yes","no"} Dyspnea: {Blank single:19197::"yes","no"} Chest pain: {Blank single:19197::"yes","no"} Lower extremity edema: {Blank single:19197::"yes","no"} Dizzy/lightheaded: {Blank single:19197::"yes","no"}  Relevant past medical, surgical, family and social history reviewed and updated as indicated. Interim medical history since our last visit reviewed. Allergies and medications reviewed and updated.  Review of Systems  Per HPI unless specifically indicated above     Objective:    There were no vitals taken for this visit.  Wt Readings from Last 3 Encounters:  12/24/20 220 lb (99.8 kg)  11/20/20 218 lb (98.9 kg)  08/21/20 214 lb (97.1 kg)    Physical Exam  Results for orders placed or performed in visit on 12/24/20  CBC with Differential/Platelet  Result Value Ref Range   WBC 4.0 3.4 - 10.8 x10E3/uL   RBC 4.54 4.14 - 5.80 x10E6/uL   Hemoglobin 13.8 13.0 - 17.7 g/dL   Hematocrit 02.5 85.2 - 51.0 %   MCV 91 79 - 97 fL   MCH 30.4 26.6 - 33.0 pg   MCHC 33.5 31.5 - 35.7 g/dL   RDW 77.8 24.2 - 35.3 %   Platelets 288 150 - 450 x10E3/uL   Neutrophils 46 Not Estab. %   Lymphs 39 Not Estab. %   Monocytes 10 Not Estab. %   Eos 3 Not Estab. %   Basos 1 Not Estab. %   Neutrophils Absolute 1.9 1.4 - 7.0 x10E3/uL   Lymphocytes Absolute 1.6 0.7 - 3.1 x10E3/uL   Monocytes Absolute 0.4 0.1 - 0.9 x10E3/uL   EOS (ABSOLUTE) 0.1 0.0 - 0.4 x10E3/uL   Basophils Absolute 0.1 0.0 - 0.2 x10E3/uL   Immature Granulocytes 1 Not Estab. %   Immature Grans (Abs) 0.0 0.0 - 0.1 x10E3/uL  Comprehensive metabolic panel  Result Value Ref Range   Glucose 102 (H) 65 - 99 mg/dL   BUN 23 6 - 24 mg/dL   Creatinine, Ser 6.14 0.76 - 1.27 mg/dL   GFR calc non Af Amer 96 >59 mL/min/1.73   GFR calc Af Amer 112 >59 mL/min/1.73   BUN/Creatinine Ratio 26 (H) 9 - 20   Sodium 140 134 - 144 mmol/L   Potassium 4.1 3.5 - 5.2 mmol/L   Chloride 99 96 - 106 mmol/L   CO2 22 20 - 29 mmol/L    Calcium 10.1 8.7 - 10.2 mg/dL   Total Protein 7.5 6.0 - 8.5 g/dL   Albumin 4.9 3.8 - 4.9 g/dL   Globulin, Total 2.6 1.5 - 4.5 g/dL   Albumin/Globulin Ratio 1.9 1.2 - 2.2   Bilirubin Total 0.3 0.0 - 1.2 mg/dL   Alkaline Phosphatase 45 44 -  121 IU/L   AST 25 0 - 40 IU/L   ALT 22 0 - 44 IU/L  Lipid Panel w/o Chol/HDL Ratio  Result Value Ref Range   Cholesterol, Total 214 (H) 100 - 199 mg/dL   Triglycerides 814 (H) 0 - 149 mg/dL   HDL 43 >48 mg/dL   VLDL Cholesterol Cal 83 (H) 5 - 40 mg/dL   LDL Chol Calc (NIH) 88 0 - 99 mg/dL  Microalbumin, Urine Waived  Result Value Ref Range   Microalb, Ur Waived 10 0 - 19 mg/L   Creatinine, Urine Waived 100 10 - 300 mg/dL   Microalb/Creat Ratio <30 <30 mg/g  Uric acid  Result Value Ref Range   Uric Acid 4.9 3.8 - 8.4 mg/dL      Assessment & Plan:   Problem List Items Addressed This Visit       Cardiovascular and Mediastinum   Hypertension - Primary     Other   Hyperlipidemia     Follow up plan: No follow-ups on file.

## 2021-07-13 NOTE — Assessment & Plan Note (Signed)
Chronic.  Controlled.  Continue with current medication regimen on Allopurinal 100mg  daily.  Refills sent.  Labs ordered today.  Return to clinic in 6 months for reevaluation.  Call sooner if concerns arise.

## 2021-07-13 NOTE — Progress Notes (Signed)
BP 124/76 (BP Location: Right Arm, Cuff Size: Normal)   Pulse 83   Temp 98.4 F (36.9 C) (Oral)   Ht 5' 10.6" (1.793 m)   Wt 219 lb 6.4 oz (99.5 kg)   SpO2 97%   BMI 30.95 kg/m    Subjective:    Patient ID: David Barrera, male    DOB: 03/25/1966, 55 y.o.   MRN: [id number]  HPI: David Barrera is a 55 y.o. male presenting on 07/13/2021 for comprehensive medical examination. Current medical complaints include: Depression/Anxiety  He currently lives with: Interim Problems from his last visit: yes  HYPERTENSION / HYPERLIPIDEMIA Satisfied with current treatment? yes Duration of hypertension: years BP monitoring frequency: not checking BP range:  BP medication side effects: no Past BP meds:  Metoprolol, amlodipine, and HCTZ Duration of hyperlipidemia: years Cholesterol medication side effects: no Cholesterol supplements: none Past cholesterol medications: rosuvastatin (crestor) and fenofibrate (tricor) Medication compliance: excellent compliance Aspirin: yes Recent stressors: no Recurrent headaches: no Visual changes: no Palpitations: no Dyspnea: no Chest pain: no Lower extremity edema: no Dizzy/lightheaded: no  DEPRESSION/ANXIETY Patient states he is having a hard time with depression and anxiety.  Denies SI. Would like to try medication.      GAD 7 : Generalized Anxiety Score 07/13/2021 11/20/2020 08/21/2020 01/28/2020  Nervous, Anxious, on Edge 3 2 0 1  Control/stop worrying 3 3 0 1  Worry too much - different things 3 0 0 1  Trouble relaxing 3 0 0 1  Restless 0 0 0 0  Easily annoyed or irritable 2 0 0 0  Afraid - awful might happen 0 0 0 0  Total GAD 7 Score 14 5 0 4  Anxiety Difficulty Not difficult at all Somewhat difficult Not difficult at all Not difficult at all      Depression Screen done today and results listed below:  Depression screen [facility] 2/9 07/13/2021 12/24/2020 11/20/2020 08/21/2020 01/28/2020  Decreased Interest 2 1 0 0 0  Down, Depressed,  Hopeless 3 1 2  0 0  PHQ - 2 Score 5 2 2  0 0  Altered sleeping 2 0 0 0 0  Tired, decreased energy 3 1 2  0 3  Change in appetite 0 0 0 0 0  Feeling bad or failure about yourself  0 0 0 0 0  Trouble concentrating 1 0 0 0 0  Moving slowly or fidgety/restless 0 0 0 0 0  Suicidal thoughts 0 0 0 0 0  PHQ-9 Score 11 3 4  0 3  Difficult doing work/chores Not difficult at all Not difficult at all Somewhat difficult Not difficult at all -    The patient does not have a history of falls. I did complete a risk assessment for falls. A plan of care for falls was documented.   Past Medical History:  Past Medical History:  Diagnosis Date   Allergy    GERD (gastroesophageal reflux disease)    Hyperlipidemia    Hypertension     Surgical History:  Past Surgical History:  Procedure Laterality Date   COLONOSCOPY WITH PROPOFOL     COLONOSCOPY WITH PROPOFOL N/A 06/03/2020   Procedure: COLONOSCOPY WITH PROPOFOL;  Surgeon: , MD;  Location: [facility] ENDOSCOPY;  Service: Endoscopy;  Laterality: N/A;   VASECTOMY      Medications:  Current Outpatient Medications on File Prior to Visit  Medication Sig   aspirin EC 81 MG tablet Take 81 mg by mouth daily.   metoprolol succinate (TOPROL-XL) 100 MG  24 hr tablet TAKE 1 TABLET BY MOUTH DAILY. TAKE WITH OR IMMEDIATELY FOLLOWING A MEAL.   omeprazole (PRILOSEC) 20 MG capsule TAKE 1 CAPSULE BY MOUTH EVERY DAY   rosuvastatin (CRESTOR) 20 MG tablet TAKE 1 TABLET BY MOUTH EVERY DAY   No current facility-administered medications on file prior to visit.    Allergies:  Allergies  Allergen Reactions   Benazepril Hcl Cough   Lipitor [Atorvastatin] Other (See Comments)    Myalgia leg pain    Social History:  Social History   Socioeconomic History   Marital status: Married    Spouse name: Not on file   Number of children: Not on file   Years of education: Not on file   Highest education level: Not on file  Occupational History   Not on file   Tobacco Use   Smoking status: Never   Smokeless tobacco: Never  Vaping Use   Vaping Use: Never used  Substance and Sexual Activity   Alcohol use: Yes    Alcohol/week: 25.0 standard drinks    Types: 25 Shots of liquor per week   Drug use: No   Sexual activity: Yes  Other Topics Concern   Not on file  Social History Narrative   Not on file   Social Determinants of Health   Financial Resource Strain: Not on file  Food Insecurity: Not on file  Transportation Needs: Not on file  Physical Activity: Not on file  Stress: Not on file  Social Connections: Not on file  Intimate Partner Violence: Not on file   Social History   Tobacco Use  Smoking Status Never  Smokeless Tobacco Never   Social History   Substance and Sexual Activity  Alcohol Use Yes   Alcohol/week: 25.0 standard drinks   Types: 25 Shots of liquor per week    Family History:  Family History  Problem Relation Age of Onset   Cancer Sister    Diabetes Brother     Past medical history, surgical history, medications, allergies, family history and social history reviewed with patient today and changes made to appropriate areas of the chart.   Review of Systems  Eyes:  Negative for blurred vision and double vision.  Respiratory:  Negative for shortness of breath.   Cardiovascular:  Negative for chest pain, palpitations and leg swelling.  Neurological:  Negative for dizziness and headaches.  All other ROS negative except what is listed above and in the HPI.      Objective:    BP 124/76 (BP Location: Right Arm, Cuff Size: Normal)   Pulse 83   Temp 98.4 F (36.9 C) (Oral)   Ht 5' 10.6" (1.793 m)   Wt 219 lb 6.4 oz (99.5 kg)   SpO2 97%   BMI 30.95 kg/m   Wt Readings from Last 3 Encounters:  07/13/21 219 lb 6.4 oz (99.5 kg)  12/24/20 220 lb (99.8 kg)  11/20/20 218 lb (98.9 kg)    Physical Exam Vitals and nursing note reviewed.  Constitutional:      General: He is not in acute distress.     Appearance: Normal appearance. He is not ill-appearing, toxic-appearing or diaphoretic.  HENT:     Head: Normocephalic.     Right Ear: Tympanic membrane, ear canal and external ear normal.     Left Ear: Tympanic membrane, ear canal and external ear normal.     Nose: Nose normal. No congestion or rhinorrhea.     Mouth/Throat:     Mouth: Mucous  membranes are moist.  Eyes:     General:        Right eye: No discharge.        Left eye: No discharge.     Extraocular Movements: Extraocular movements intact.     Conjunctiva/sclera: Conjunctivae normal.     Pupils: Pupils are equal, round, and reactive to light.  Cardiovascular:     Rate and Rhythm: Normal rate and regular rhythm.     Heart sounds: No murmur heard. Pulmonary:     Effort: Pulmonary effort is normal. No respiratory distress.     Breath sounds: Normal breath sounds. No wheezing, rhonchi or rales.  Abdominal:     General: Abdomen is flat. Bowel sounds are normal. There is no distension.     Palpations: Abdomen is soft.     Tenderness: There is no abdominal tenderness. There is no guarding.  Musculoskeletal:     Cervical back: Normal range of motion and neck supple.  Skin:    General: Skin is warm and dry.     Capillary Refill: Capillary refill takes less than 2 seconds.  Neurological:     General: No focal deficit present.     Mental Status: He is alert and oriented to person, place, and time.     Cranial Nerves: No cranial nerve deficit.     Motor: No weakness.     Deep Tendon Reflexes: Reflexes normal.  Psychiatric:        Mood and Affect: Mood normal.        Behavior: Behavior normal.        Thought Content: Thought content normal.        Judgment: Judgment normal.    Results for orders placed or performed in visit on 12/24/20  CBC with Differential/Platelet  Result Value Ref Range   WBC 4.0 3.4 - 10.8 x10E3/uL   RBC 4.54 4.14 - 5.80 x10E6/uL   Hemoglobin 13.8 13.0 - 17.7 g/dL   Hematocrit 02.5 85.2 - 51.0 %    MCV 91 79 - 97 fL   MCH 30.4 26.6 - 33.0 pg   MCHC 33.5 31.5 - 35.7 g/dL   RDW 77.8 24.2 - 35.3 %   Platelets 288 150 - 450 x10E3/uL   Neutrophils 46 Not Estab. %   Lymphs 39 Not Estab. %   Monocytes 10 Not Estab. %   Eos 3 Not Estab. %   Basos 1 Not Estab. %   Neutrophils Absolute 1.9 1.4 - 7.0 x10E3/uL   Lymphocytes Absolute 1.6 0.7 - 3.1 x10E3/uL   Monocytes Absolute 0.4 0.1 - 0.9 x10E3/uL   EOS (ABSOLUTE) 0.1 0.0 - 0.4 x10E3/uL   Basophils Absolute 0.1 0.0 - 0.2 x10E3/uL   Immature Granulocytes 1 Not Estab. %   Immature Grans (Abs) 0.0 0.0 - 0.1 x10E3/uL  Comprehensive metabolic panel  Result Value Ref Range   Glucose 102 (H) 65 - 99 mg/dL   BUN 23 6 - 24 mg/dL   Creatinine, Ser 6.14 0.76 - 1.27 mg/dL   GFR calc non Af Amer 96 >59 mL/min/1.73   GFR calc Af Amer 112 >59 mL/min/1.73   BUN/Creatinine Ratio 26 (H) 9 - 20   Sodium 140 134 - 144 mmol/L   Potassium 4.1 3.5 - 5.2 mmol/L   Chloride 99 96 - 106 mmol/L   CO2 22 20 - 29 mmol/L   Calcium 10.1 8.7 - 10.2 mg/dL   Total Protein 7.5 6.0 - 8.5 g/dL   Albumin 4.9 3.8 -  4.9 g/dL   Globulin, Total 2.6 1.5 - 4.5 g/dL   Albumin/Globulin Ratio 1.9 1.2 - 2.2   Bilirubin Total 0.3 0.0 - 1.2 mg/dL   Alkaline Phosphatase 45 44 - 121 IU/L   AST 25 0 - 40 IU/L   ALT 22 0 - 44 IU/L  Lipid Panel w/o Chol/HDL Ratio  Result Value Ref Range   Cholesterol, Total 214 (H) 100 - 199 mg/dL   Triglycerides 161506 (H) 0 - 149 mg/dL   HDL 43 >09>39 mg/dL   VLDL Cholesterol Cal 83 (H) 5 - 40 mg/dL   LDL Chol Calc (NIH) 88 0 - 99 mg/dL  Microalbumin, Urine Waived  Result Value Ref Range   Microalb, Ur Waived 10 0 - 19 mg/L   Creatinine, Urine Waived 100 10 - 300 mg/dL   Microalb/Creat Ratio <30 <30 mg/g  Uric acid  Result Value Ref Range   Uric Acid 4.9 3.8 - 8.4 mg/dL      Assessment & Plan:   Problem List Items Addressed This Visit       Cardiovascular and Mediastinum   Hypertension    Chronic.  Controlled.  Continue with current  medication regimen.  Refills sent.  Labs ordered today.  Return to clinic in 6 months for reevaluation.  Call sooner if concerns arise.        Relevant Medications   amLODipine (NORVASC) 5 MG tablet   Choline Fenofibrate (FENOFIBRIC ACID) 135 MG CPDR   hydrochlorothiazide (HYDRODIURIL) 25 MG tablet   sildenafil (REVATIO) 20 MG tablet   Other Relevant Orders   Comprehensive metabolic panel     Other   Hyperlipidemia    Chronic.  Controlled.  Continue with current medication regimen.  Labs ordered today.  Return to clinic in 6 months for reevaluation.  Call sooner if concerns arise.        Relevant Medications   amLODipine (NORVASC) 5 MG tablet   Choline Fenofibrate (FENOFIBRIC ACID) 135 MG CPDR   hydrochlorothiazide (HYDRODIURIL) 25 MG tablet   sildenafil (REVATIO) 20 MG tablet   Other Relevant Orders   Lipid panel   Gout    Chronic.  Controlled.  Continue with current medication regimen on Allopurinal 100mg  daily.  Refills sent.  Labs ordered today.  Return to clinic in 6 months for reevaluation.  Call sooner if concerns arise.        Relevant Medications   allopurinol (ZYLOPRIM) 100 MG tablet   Depression, recurrent (HCC)    Chronic.  Not well controlled. Phq9 at visit today is 11.  Will start Effexor 37.5mg  daily.  Side effects and benefits of medication discussed at visit today.  Follow up in 6 weeks for reevaluation.       Relevant Medications   venlafaxine XR (EFFEXOR XR) 37.5 MG 24 hr capsule   Anxiety    Chronic.  Not well controlled. GAD7 at visit today is 14.  Will start Effexor 37.5mg  daily.  Side effects and benefits of medication discussed at visit today.  Follow up in 6 weeks for reevaluation.       Relevant Medications   venlafaxine XR (EFFEXOR XR) 37.5 MG 24 hr capsule   Other Visit Diagnoses     Annual physical exam    -  Primary   Health Maintenance reviewed during visit today. Labs ordered. Flu shot given. Discussed benefits of Shingles vaccine.    Relevant Orders   TSH   PSA   Lipid panel   CBC with Differential/Platelet  Comprehensive metabolic panel   Urinalysis, Routine w reflex microscopic   Need for influenza vaccination       Relevant Orders   Flu Vaccine QUAD 45mo+IM (Fluarix, Fluzone & Alfiuria Quad PF) (Completed)        Discussed aspirin prophylaxis for myocardial infarction prevention and decision was made to continue ASA  LABORATORY TESTING:  Health maintenance labs ordered today as discussed above.   The natural history of prostate cancer and ongoing controversy regarding screening and potential treatment outcomes of prostate cancer has been discussed with the patient. The meaning of a false positive PSA and a false negative PSA has been discussed. He indicates understanding of the limitations of this screening test and wishes to proceed with screening PSA testing.   IMMUNIZATIONS:   - Tdap: Tetanus vaccination status reviewed: last tetanus booster within 10 years. - Influenza: Up to date - Pneumovax: Not applicable - Prevnar: Not applicable - HPV: Not applicable - Zostavax vaccine:  reviewed today  SCREENING: - Colonoscopy: Up to date  Discussed with patient purpose of the colonoscopy is to detect colon cancer at curable precancerous or early stages   - AAA Screening: Not applicable  -Hearing Test: Not applicable  -Spirometry: Not applicable   PATIENT COUNSELING:    Sexuality: Discussed sexually transmitted diseases, partner selection, use of condoms, avoidance of unintended pregnancy  and contraceptive alternatives.   Advised to avoid cigarette smoking.  I discussed with the patient that most people either abstain from alcohol or drink within safe limits (<=14/week and <=4 drinks/occasion for males, <=7/weeks and <= 3 drinks/occasion for females) and that the risk for alcohol disorders and other health effects rises proportionally with the number of drinks per week and how often a drinker exceeds  daily limits.  Discussed cessation/primary prevention of drug use and availability of treatment for abuse.   Diet: Encouraged to adjust caloric intake to maintain  or achieve ideal body weight, to reduce intake of dietary saturated fat and total fat, to limit sodium intake by avoiding high sodium foods and not adding table salt, and to maintain adequate dietary potassium and calcium preferably from fresh fruits, vegetables, and low-fat dairy products.    stressed the importance of regular exercise  Injury prevention: Discussed safety belts, safety helmets, smoke detector, smoking near bedding or upholstery.   Dental health: Discussed importance of regular tooth brushing, flossing, and dental visits.   Follow up plan: NEXT PREVENTATIVE PHYSICAL DUE IN 1 YEAR. Return in about 6 weeks (around 08/24/2021) for Depression/Anxiety FU.

## 2021-07-13 NOTE — Progress Notes (Signed)
Hi David Barrera. Your urine from today looks good.  I will send you another message once the rest of your lab work comes back.

## 2021-07-14 LAB — COMPREHENSIVE METABOLIC PANEL
ALT: 19 IU/L (ref 0–44)
AST: 26 IU/L (ref 0–40)
Albumin/Globulin Ratio: 2.2 (ref 1.2–2.2)
Albumin: 5 g/dL — ABNORMAL HIGH (ref 3.8–4.9)
Alkaline Phosphatase: 42 IU/L — ABNORMAL LOW (ref 44–121)
BUN/Creatinine Ratio: 18 (ref 9–20)
BUN: 18 mg/dL (ref 6–24)
Bilirubin Total: 0.5 mg/dL (ref 0.0–1.2)
CO2: 23 mmol/L (ref 20–29)
Calcium: 10.2 mg/dL (ref 8.7–10.2)
Chloride: 95 mmol/L — ABNORMAL LOW (ref 96–106)
Creatinine, Ser: 1 mg/dL (ref 0.76–1.27)
Globulin, Total: 2.3 g/dL (ref 1.5–4.5)
Glucose: 113 mg/dL — ABNORMAL HIGH (ref 65–99)
Potassium: 3.7 mmol/L (ref 3.5–5.2)
Sodium: 137 mmol/L (ref 134–144)
Total Protein: 7.3 g/dL (ref 6.0–8.5)
eGFR: 88 mL/min/{1.73_m2} (ref >59)

## 2021-07-14 LAB — CBC WITH DIFFERENTIAL/PLATELET
Basophils Absolute: 0.1 10*3/uL (ref 0.0–0.2)
Basos: 1 %
EOS (ABSOLUTE): 0.1 10*3/uL (ref 0.0–0.4)
Eos: 2 %
Hematocrit: 40.3 % (ref 37.5–51.0)
Hemoglobin: 13.9 g/dL (ref 13.0–17.7)
Immature Grans (Abs): 0 10*3/uL (ref 0.0–0.1)
Immature Granulocytes: 0 %
Lymphocytes Absolute: 1.7 10*3/uL (ref 0.7–3.1)
Lymphs: 36 %
MCH: 30.9 pg (ref 26.6–33.0)
MCHC: 34.5 g/dL (ref 31.5–35.7)
MCV: 90 fL (ref 79–97)
Monocytes Absolute: 0.5 10*3/uL (ref 0.1–0.9)
Monocytes: 9 %
Neutrophils Absolute: 2.4 10*3/uL (ref 1.4–7.0)
Neutrophils: 52 %
Platelets: 302 10*3/uL (ref 150–450)
RBC: 4.5 x10E6/uL (ref 4.14–5.80)
RDW: 12.5 % (ref 11.6–15.4)
WBC: 4.8 10*3/uL (ref 3.4–10.8)

## 2021-07-14 LAB — LIPID PANEL
Chol/HDL Ratio: 3.9 ratio (ref 0.0–5.0)
Cholesterol, Total: 194 mg/dL (ref 100–199)
HDL: 50 mg/dL (ref >39)
LDL Chol Calc (NIH): 80 mg/dL (ref 0–99)
Triglycerides: 397 mg/dL — ABNORMAL HIGH (ref 0–149)
VLDL Cholesterol Cal: 64 mg/dL — ABNORMAL HIGH (ref 5–40)

## 2021-07-14 LAB — PSA: Prostate Specific Ag, Serum: 1.4 ng/mL (ref 0.0–4.0)

## 2021-07-14 LAB — TSH: TSH: 1.7 u[IU]/mL (ref 0.450–4.500)

## 2021-07-14 NOTE — Progress Notes (Signed)
Hi David Barrera.  Your lab work looks good.  Liver, Kidneys, electrolytes, thyroid, prostate screening, and complete blood count look good. Your cholesterol is improved from prior which is great but it is still elevated some.  Continue with a low fat diet and exercise.  Continue with the Fenofibric acid.  Follow up as discussed. Please let me know if you have any questions.

## 2021-07-23 ENCOUNTER — Other Ambulatory Visit: Payer: Self-pay | Admitting: Nurse Practitioner

## 2021-07-23 DIAGNOSIS — M1A00X0 Idiopathic chronic gout, unspecified site, without tophus (tophi): Secondary | ICD-10-CM

## 2021-07-23 NOTE — Telephone Encounter (Signed)
Pharmacy states they did not receive refill 9/112/22.

## 2021-07-24 ENCOUNTER — Telehealth: Payer: Self-pay | Admitting: Nurse Practitioner

## 2021-07-24 DIAGNOSIS — M1A00X0 Idiopathic chronic gout, unspecified site, without tophus (tophi): Secondary | ICD-10-CM

## 2021-07-24 MED ORDER — ALLOPURINOL 100 MG PO TABS: 100.0000 mg | ORAL_TABLET | Freq: Every day | ORAL | 1 refills | Status: DC

## 2021-07-24 NOTE — Telephone Encounter (Signed)
Medication sent to the pharmacy.

## 2021-07-30 DIAGNOSIS — G4733 Obstructive sleep apnea (adult) (pediatric): Secondary | ICD-10-CM | POA: Diagnosis not present

## 2021-08-04 ENCOUNTER — Other Ambulatory Visit: Payer: Self-pay | Admitting: Nurse Practitioner

## 2021-08-04 NOTE — Telephone Encounter (Signed)
Requested medication (s) are due for refill today: request 90 day supply   Requested medication (s) are on the active medication list: yes  Last refill:  07/13/21 #60 0 refills  Future visit scheduled: yes in 3 weeks  Notes to clinic:  Pharmacy comment: REQUEST FOR 90 DAYS PRESCRIPTION.     Requested Prescriptions  Pending Prescriptions Disp Refills   venlafaxine XR (EFFEXOR-XR) 37.5 MG 24 hr capsule [Pharmacy Med Name: VENLAFAXINE HCL ER 37.5 MG CAP] 90 capsule 1    Sig: TAKE 1 CAPSULE BY MOUTH DAILY WITH BREAKFAST.     Psychiatry: Antidepressants - SNRI - desvenlafaxine & venlafaxine Failed - 08/04/2021  1:34 PM      Failed - Triglycerides in normal range and within 360 days    Triglycerides  Date Value Ref Range Status  07/13/2021 397 (H) 0 - 149 mg/dL Final   Triglycerides Piccolo,Waived  Date Value Ref Range Status  04/25/2019 381 (H) <150 mg/dL Final    Comment:                            Normal                   <150                         Borderline High     150 - 199                         High                200 - 499                         Very High                >499           Passed - LDL in normal range and within 360 days    LDL Chol Calc (NIH)  Date Value Ref Range Status  07/13/2021 80 0 - 99 mg/dL Final          Passed - Total Cholesterol in normal range and within 360 days    Cholesterol, Total  Date Value Ref Range Status  07/13/2021 194 100 - 199 mg/dL Final   Cholesterol Piccolo, Waived  Date Value Ref Range Status  04/25/2019 169 <200 mg/dL Final    Comment:                            Desirable                <200                         Borderline High      200- 239                         High                     >239           Passed - Completed PHQ-2 or PHQ-9 in the last 360 days      Passed - Last BP in normal range    BP Readings from Last 1  Encounters:  07/13/21 124/76          Passed - Valid encounter within last 6  months    Recent Outpatient Visits           3 weeks ago Annual physical exam   [facility] Larae Grooms, NP   7 months ago Primary hypertension   [facility] [place], [place], DO   8 months ago Immunization due   [facility] Gabriel Cirri, NP   11 months ago Obstructive sleep apnea syndrome   [facility] Gabriel Cirri, NP   1 year ago Essential hypertension   [facility] Particia Nearing, [place]       Future Appointments             In 3 weeks Larae Grooms, NP [facility], [facility]

## 2021-08-27 ENCOUNTER — Ambulatory Visit: Payer: BC Managed Care – PPO | Admitting: Nurse Practitioner

## 2021-08-29 DIAGNOSIS — G4733 Obstructive sleep apnea (adult) (pediatric): Secondary | ICD-10-CM | POA: Diagnosis not present

## 2021-09-01 ENCOUNTER — Other Ambulatory Visit: Payer: Self-pay | Admitting: Nurse Practitioner

## 2021-09-01 NOTE — Telephone Encounter (Signed)
Pt called to advise David Barrera that he is doing well on the medication venlafaxine XR (EFFEXOR-XR) 37.5 MG 24 hr capsule  and has no issues so far

## 2021-09-01 NOTE — Telephone Encounter (Signed)
Just an fyi.  Please advise if anything further is needed.

## 2021-09-01 NOTE — Telephone Encounter (Signed)
Requested medication (s) are due for refill today - yes  Requested medication (s) are on the active medication list -yes  Future visit scheduled -no  Last refill: 08/05/21 #30  Notes to clinic: Request RF: Call to patient - left message to call office- no follow up on new start medication- RF request sent for review   Requested Prescriptions  Pending Prescriptions Disp Refills   venlafaxine XR (EFFEXOR-XR) 37.5 MG 24 hr capsule [Pharmacy Med Name: VENLAFAXINE HCL ER 37.5 MG CAP] 90 capsule 1    Sig: TAKE 1 CAPSULE BY MOUTH DAILY WITH BREAKFAST.     Psychiatry: Antidepressants - SNRI - desvenlafaxine & venlafaxine Failed - 09/01/2021  1:35 PM      Failed - Triglycerides in normal range and within 360 days    Triglycerides  Date Value Ref Range Status  07/13/2021 397 (H) 0 - 149 mg/dL Final   Triglycerides Piccolo,Waived  Date Value Ref Range Status  04/25/2019 381 (H) <150 mg/dL Final    Comment:                            Normal                   <150                         Borderline High     150 - 199                         High                200 - 499                         Very High                >499           Passed - LDL in normal range and within 360 days    LDL Chol Calc (NIH)  Date Value Ref Range Status  07/13/2021 80 0 - 99 mg/dL Final          Passed - Total Cholesterol in normal range and within 360 days    Cholesterol, Total  Date Value Ref Range Status  07/13/2021 194 100 - 199 mg/dL Final   Cholesterol Piccolo, Waived  Date Value Ref Range Status  04/25/2019 169 <200 mg/dL Final    Comment:                            Desirable                <200                         Borderline High      200- 239                         High                     >239           Passed - Completed PHQ-2 or PHQ-9 in the last 360 days      Passed - Last BP in normal range    BP  Readings from Last 1 Encounters:  07/13/21 124/76          Passed - Valid  encounter within last 6 months    Recent Outpatient Visits           1 month ago Annual physical exam   [facility] Larae Grooms, NP   8 months ago Primary hypertension   [facility] [place], [place], DO   9 months ago Immunization due   [facility] Gabriel Cirri, NP   1 year ago Obstructive sleep apnea syndrome   [facility] Gabriel Cirri, NP   1 year ago Essential hypertension   [facility] Particia Nearing, [place]       Future Appointments             In 1 month Paseda, Baird Kay, RN [facility]               Requested Prescriptions  Pending Prescriptions Disp Refills   venlafaxine XR (EFFEXOR-XR) 37.5 MG 24 hr capsule [Pharmacy Med Name: VENLAFAXINE HCL ER 37.5 MG CAP] 90 capsule 1    Sig: TAKE 1 CAPSULE BY MOUTH DAILY WITH BREAKFAST.     Psychiatry: Antidepressants - SNRI - desvenlafaxine & venlafaxine Failed - 09/01/2021  1:35 PM      Failed - Triglycerides in normal range and within 360 days    Triglycerides  Date Value Ref Range Status  07/13/2021 397 (H) 0 - 149 mg/dL Final   Triglycerides Piccolo,Waived  Date Value Ref Range Status  04/25/2019 381 (H) <150 mg/dL Final    Comment:                            Normal                   <150                         Borderline High     150 - 199                         High                200 - 499                         Very High                >499           Passed - LDL in normal range and within 360 days    LDL Chol Calc (NIH)  Date Value Ref Range Status  07/13/2021 80 0 - 99 mg/dL Final          Passed - Total Cholesterol in normal range and within 360 days    Cholesterol, Total  Date Value Ref Range Status  07/13/2021 194 100 - 199 mg/dL Final   Cholesterol Piccolo, Waived  Date Value Ref Range Status  04/25/2019 169 <200 mg/dL Final    Comment:                            Desirable                 <200  Borderline High      200- 239                         High                     >239           Passed - Completed PHQ-2 or PHQ-9 in the last 360 days      Passed - Last BP in normal range    BP Readings from Last 1 Encounters:  07/13/21 124/76          Passed - Valid encounter within last 6 months    Recent Outpatient Visits           1 month ago Annual physical exam   [facility] Larae Grooms, NP   8 months ago Primary hypertension   [facility] [place], [place], DO   9 months ago Immunization due   [facility] Gabriel Cirri, NP   1 year ago Obstructive sleep apnea syndrome   [facility] Gabriel Cirri, NP   1 year ago Essential hypertension   [facility] Particia Nearing, [place]       Future Appointments             In 1 month Paseda, Baird Kay, RN [facility], [facility]

## 2021-09-01 NOTE — Telephone Encounter (Signed)
Please let patient know that I am glad he is doing well.  He does need a follow up to continue the medication.  I have sent him in 30 days.  He can do a virtual.

## 2021-09-28 ENCOUNTER — Other Ambulatory Visit: Payer: Self-pay | Admitting: Nurse Practitioner

## 2021-09-29 DIAGNOSIS — G4733 Obstructive sleep apnea (adult) (pediatric): Secondary | ICD-10-CM | POA: Diagnosis not present

## 2021-09-29 NOTE — Telephone Encounter (Signed)
Requested medications are due for refill today yes  Requested medications are on the active medication list yes  Last refill 09/01/21  Last visit 07/13/21  Future visit scheduled NO, was asked to return in 6 weeks, has already had a curtesy refill  Notes to clinic Has already had a curtesy refill and there is no upcoming appointment scheduled. Requested Prescriptions  Pending Prescriptions Disp Refills   venlafaxine XR (EFFEXOR-XR) 37.5 MG 24 hr capsule [Pharmacy Med Name: VENLAFAXINE HCL ER 37.5 MG CAP] 90 capsule 1    Sig: TAKE 1 CAPSULE BY MOUTH DAILY WITH BREAKFAST.     Psychiatry: Antidepressants - SNRI - desvenlafaxine & venlafaxine Failed - 09/28/2021  9:32 AM      Failed - Triglycerides in normal range and within 360 days    Triglycerides  Date Value Ref Range Status  07/13/2021 397 (H) 0 - 149 mg/dL Final   Triglycerides Piccolo,Waived  Date Value Ref Range Status  04/25/2019 381 (H) <150 mg/dL Final    Comment:                            Normal                   <150                         Borderline High     150 - 199                         High                200 - 499                         Very High                >499           Passed - LDL in normal range and within 360 days    LDL Chol Calc (NIH)  Date Value Ref Range Status  07/13/2021 80 0 - 99 mg/dL Final          Passed - Total Cholesterol in normal range and within 360 days    Cholesterol, Total  Date Value Ref Range Status  07/13/2021 194 100 - 199 mg/dL Final   Cholesterol Piccolo, Waived  Date Value Ref Range Status  04/25/2019 169 <200 mg/dL Final    Comment:                            Desirable                <200                         Borderline High      200- 239                         High                     >239           Passed - Completed PHQ-2 or PHQ-9 in the last 360 days      Passed - Last BP in normal range  BP Readings from Last 1 Encounters:  07/13/21 124/76           Passed - Valid encounter within last 6 months    Recent Outpatient Visits           2 months ago Annual physical exam   [facility] Larae Grooms, NP   9 months ago Primary hypertension   [facility] [place], [place], DO   10 months ago Immunization due   [facility] Gabriel Cirri, NP   1 year ago Obstructive sleep apnea syndrome   [facility] Gabriel Cirri, NP   1 year ago Essential hypertension   [facility] Particia Nearing, [place]       Future Appointments             In 2 days Paseda, Baird Kay, FNP [facility]

## 2021-09-30 ENCOUNTER — Other Ambulatory Visit: Payer: Self-pay | Admitting: Nurse Practitioner

## 2021-10-01 ENCOUNTER — Other Ambulatory Visit: Payer: Self-pay

## 2021-10-01 ENCOUNTER — Ambulatory Visit: Payer: BC Managed Care – PPO | Admitting: Nurse Practitioner

## 2021-10-01 ENCOUNTER — Encounter: Payer: Self-pay | Admitting: Nurse Practitioner

## 2021-10-01 VITALS — BP 161/93 | HR 93 | Ht 72.0 in | Wt 227.0 lb

## 2021-10-01 DIAGNOSIS — T7840XA Allergy, unspecified, initial encounter: Secondary | ICD-10-CM

## 2021-10-01 DIAGNOSIS — F339 Major depressive disorder, recurrent, unspecified: Secondary | ICD-10-CM | POA: Diagnosis not present

## 2021-10-01 DIAGNOSIS — I10 Essential (primary) hypertension: Secondary | ICD-10-CM | POA: Diagnosis not present

## 2021-10-01 DIAGNOSIS — E785 Hyperlipidemia, unspecified: Secondary | ICD-10-CM

## 2021-10-01 DIAGNOSIS — Z23 Encounter for immunization: Secondary | ICD-10-CM | POA: Diagnosis not present

## 2021-10-01 DIAGNOSIS — M25562 Pain in left knee: Secondary | ICD-10-CM

## 2021-10-01 DIAGNOSIS — E669 Obesity, unspecified: Secondary | ICD-10-CM

## 2021-10-01 DIAGNOSIS — K529 Noninfective gastroenteritis and colitis, unspecified: Secondary | ICD-10-CM

## 2021-10-01 DIAGNOSIS — G8929 Other chronic pain: Secondary | ICD-10-CM

## 2021-10-01 DIAGNOSIS — R1010 Upper abdominal pain, unspecified: Secondary | ICD-10-CM | POA: Diagnosis not present

## 2021-10-01 DIAGNOSIS — M1A00X0 Idiopathic chronic gout, unspecified site, without tophus (tophi): Secondary | ICD-10-CM

## 2021-10-01 MED ORDER — AMLODIPINE BESYLATE 2.5 MG PO TABS: 2.5000 mg | ORAL_TABLET | Freq: Every day | ORAL | 3 refills | Status: DC

## 2021-10-01 MED ORDER — LOPERAMIDE HCL 2 MG PO TABS: 2.0000 mg | ORAL_TABLET | Freq: Four times a day (QID) | ORAL | 0 refills | Status: DC | PRN

## 2021-10-01 MED ORDER — CETIRIZINE HCL 10 MG PO TABS: 10.0000 mg | ORAL_TABLET | Freq: Every day | ORAL | 11 refills | Status: DC

## 2021-10-01 MED ORDER — VENLAFAXINE HCL ER 37.5 MG PO CP24: 37.5000 mg | ORAL_CAPSULE | Freq: Every day | ORAL | 3 refills | Status: DC

## 2021-10-01 MED ORDER — AMLODIPINE BESYLATE 5 MG PO TABS: 5.0000 mg | ORAL_TABLET | Freq: Every day | ORAL | 1 refills | Status: DC

## 2021-10-01 NOTE — Assessment & Plan Note (Signed)
Use immodium PRN. GI referral

## 2021-10-01 NOTE — Addendum Note (Signed)
Addended by: Donell Beers on: 10/01/2021 07:21 PM   Modules accepted: Level of Service

## 2021-10-01 NOTE — Assessment & Plan Note (Signed)
BP Readings from Last 3 Encounters:  10/01/21 (!) 161/93  07/13/21 124/76  12/24/20 136/89  start amlodipine 75mg  daily

## 2021-10-01 NOTE — Assessment & Plan Note (Signed)
zyrtec 10 mg daily

## 2021-10-01 NOTE — Assessment & Plan Note (Signed)
Importance of regular vigorous exercise 30 minutes 5 times a week, portion control ,Low fat , high protein diet dicussed with pt.

## 2021-10-01 NOTE — Assessment & Plan Note (Signed)
Has chronic diarrhea, intermittent right upper abdominal pain. Refer to GI

## 2021-10-01 NOTE — Assessment & Plan Note (Signed)
Currently controlled. Recheck labs next visit.

## 2021-10-01 NOTE — Progress Notes (Signed)
   David Barrera     MRN: [id number]      DOB: 1965/12/08   HPI David Barrera is here to establish care.Previous PCP at [facility].    Pt c/o left knee pain that started 3 weeks ago, knee does not hurt sitting , when walking pain is 5/10, denies swelling, stiffness, redness, he has never had this type of pain before.he denies trauma to the left knee. Pt has been using ibuprofen at home.   Pt c/o chronic diarrhea, has intermittent crampy  pain on the right upper abdominal quadrant.  He has had abdominal US done in the past and it was normal. Pt had a normal colonoscopy done last year. Denies bloody stool , N/V.,   PT c/o chronic non productive  cough, he recently had URI., he does not smoke   ROS Denies recent fever or chills. Denies sinus pressure, nasal congestion, ear pain or sore throat. Denies chest congestion, or wheezing. Denies chest pains, palpitations and leg swelling Denies nausea, vomiting, has chronic diarrhea intermittent abdominal pain  Denies dysuria, frequency, hesitancy continence. Left knee joint pain , no swelling and limitation in mobility. Denies headaches, seizures, numbness, or tingling. Denies depression, anxiety or insomnia. Denies skin break down or rash.   PE  BP (!) 161/93 (BP Location: Left Arm, Patient Position: Sitting, Cuff Size: Normal)   Pulse 93   Ht 6' (1.829 m)   Wt 227 lb (103 kg)   SpO2 95%   BMI 30.79 kg/m   Patient alert and oriented and in no cardiopulmonary distress.  HEENT: No facial asymmetry, EOMI,     Neck supple .  Chest: Clear to auscultation bilaterally.  CVS: S1, S2 no murmurs, no S3.Regular rate.  ABD: Soft non tender.   Ext: No edema  MS: Adequate ROM spine, shoulders, hips and knees. Left knee tenderness with movement  Skin: Intact, no ulcerations or rash noted.  Psych: Good eye contact, normal affect. Memory intact not anxious or depressed appearing.  CNS: CN 2-12 intact, power,  normal  throughout.no focal deficits noted.   Assessment & Plan

## 2021-10-01 NOTE — Assessment & Plan Note (Signed)
Continue current med 

## 2021-10-01 NOTE — Assessment & Plan Note (Addendum)
Continue current med.lipid panel next visit.  Eat a healthy diet, including lots of fruits and vegetables. Avoid foods with a lot of saturated and trans fats, such as red meat, butter, fried foods and cheese . Maintain a healthy weight. Lab Results  Component Value Date   CHOL 194 07/13/2021   HDL 50 07/13/2021   LDLCALC 80 07/13/2021   TRIG 397 (H) 07/13/2021   CHOLHDL 3.9 07/13/2021

## 2021-10-01 NOTE — Assessment & Plan Note (Signed)
PT want to monitor pain for now.use ibuprofen as needed.

## 2021-10-01 NOTE — Patient Instructions (Addendum)
Please get labs done 2-5 days before your next visit.  Start taking amlodipine 7.5mg  daily for high blood pressure Get your COVID booster.  It is important that you exercise regularly at least 30 minutes 5 times a week.  Think about what you will eat, plan ahead. Choose " clean, green, fresh or frozen" over canned, processed or packaged foods which are more sugary, salty and fatty. 70 to 75% of food eaten should be vegetables and fruit. Three meals at set times with snacks allowed between meals, but they must be fruit or vegetables. Aim to eat over a 12 hour period , example 7 am to 7 pm, and STOP after  your last meal of the day. Drink water,generally about 64 ounces per day, no other drink is as healthy. Fruit juice is best enjoyed in a healthy way, by EATING the fruit.  Thanks for choosing [facility], we consider it a privelige to serve you.

## 2021-10-03 NOTE — Telephone Encounter (Signed)
effective 10/01/21 has new PCP Edwin Dada FNP is new provider

## 2021-10-12 ENCOUNTER — Other Ambulatory Visit: Payer: Self-pay

## 2021-10-12 ENCOUNTER — Encounter: Payer: Self-pay | Admitting: Nurse Practitioner

## 2021-10-12 ENCOUNTER — Ambulatory Visit: Payer: BC Managed Care – PPO | Admitting: Nurse Practitioner

## 2021-10-12 VITALS — BP 148/90 | HR 96 | Ht 72.0 in | Wt 226.0 lb

## 2021-10-12 DIAGNOSIS — M25562 Pain in left knee: Secondary | ICD-10-CM | POA: Diagnosis not present

## 2021-10-12 DIAGNOSIS — I10 Essential (primary) hypertension: Secondary | ICD-10-CM | POA: Diagnosis not present

## 2021-10-12 DIAGNOSIS — T7840XD Allergy, unspecified, subsequent encounter: Secondary | ICD-10-CM | POA: Diagnosis not present

## 2021-10-12 DIAGNOSIS — T7840XA Allergy, unspecified, initial encounter: Secondary | ICD-10-CM | POA: Diagnosis not present

## 2021-10-12 MED ORDER — FLUTICASONE PROPIONATE 50 MCG/ACT NA SUSP: 2.0000 | Freq: Every day | NASAL | 6 refills | Status: DC

## 2021-10-12 MED ORDER — IBUPROFEN 600 MG PO TABS: 600.0000 mg | ORAL_TABLET | Freq: Three times a day (TID) | ORAL | 0 refills | Status: DC | PRN

## 2021-10-12 MED ORDER — CETIRIZINE HCL 10 MG PO TABS: 10.0000 mg | ORAL_TABLET | Freq: Every day | ORAL | 1 refills | Status: DC

## 2021-10-12 NOTE — Progress Notes (Signed)
   CHARISTOPHER RUMBLE     MRN: [id number]      DOB: 09/29/1966   HPI Mr. Grau is here for acute visit. Previous c/o of left knee pain still persist. left knee pain that started about 5 weeks ago, he has used OTC ibuprofen but no relieve.PT  denies redness , swelling around knee site.The pain is 5/10 and pain comes with walking. He has history of gout, denies trauma.  Pt c/o allergies, he has not received the cetrizine ordered at last visit.    ROS Denies recent fever or chills. Denies sinus pressure, ear pain or sore throat, has nasal congestion  Denies chest congestion, productive cough or wheezing. Denies chest pains, palpitations and leg swelling Denies abdominal pain, nausea, vomiting,diarrhea or constipation.   Denies dysuria, frequency, hesitancy or incontinence. Has left knee joint pain, denies swelling and limitation in mobility. Denies headaches, seizures, numbness, or tingling. Denies depression, anxiety or insomnia. Denies skin break down or rash.   PE  BP (!) 154/91 (BP Location: Right Arm, Patient Position: Sitting, Cuff Size: Normal)   Pulse 96   Ht 6' (1.829 m)   Wt 226 lb (102.5 kg)   SpO2 98%   BMI 30.65 kg/m   Patient alert and oriented and in no cardiopulmonary distress.  HEENT: No facial asymmetry, EOMI,     Neck supple . Puffy eyes, nasal nasal congestion  Chest: Clear to auscultation bilaterally.  CVS: S1, S2 no murmurs, no S3.Regular rate.  ABD: Soft non tender.   Ext: No edema  MS: Adequate ROM spine, shoulders, hips and knees., left knee site appears normal , no swelling, redness or tenderness on palpation.   Skin: Intact, no ulcerations or rash noted.  Psych: Good eye contact, normal affect. Memory intact not anxious or depressed appearing.  CNS: CN 2-12 intact, power,  normal throughout.no focal deficits noted.   Assessment & Plan

## 2021-10-12 NOTE — Assessment & Plan Note (Signed)
BP Readings from Last 3 Encounters:  10/12/21 (!) 148/90  10/01/21 (!) 161/93  07/13/21 124/76  pt as not taken his  med today. BP at home 138/98, 140/70. Avoid salty foods. Will adjust med if bp stays elevated at next visit.

## 2021-10-12 NOTE — Assessment & Plan Note (Signed)
Start zytretc 10 mg daily. Flonase nasal spray ordered

## 2021-10-12 NOTE — Patient Instructions (Signed)
Please get your labs done today.  Use ibuprofen 600mg  three times daily as needed for knee pain. If your labs come back normal we will send in a referral to orthopedics.   Prescription for Flonase and cetrizine sent your pharmacy for your allergies  It is important that you exercise regularly at least 30 minutes 5 times a week.  Think about what you will eat, plan ahead. Choose " clean, green, fresh or frozen" over canned, processed or packaged foods which are more sugary, salty and fatty. 70 to 75% of food eaten should be vegetables and fruit. Three meals at set times with snacks allowed between meals, but they must be fruit or vegetables. Aim to eat over a 12 hour period , example 7 am to 7 pm, and STOP after  your last meal of the day. Drink water,generally about 64 ounces per day, no other drink is as healthy. Fruit juice is best enjoyed in a healthy way, by EATING the fruit.  Thanks for choosing [facility], we consider it a privelige to serve you.

## 2021-10-12 NOTE — Assessment & Plan Note (Signed)
check CRP, ESR, uric acid levels today Ibuprofen $RemoveBeforeD'600mg'[id number]$  TID PRN. Will refer to otho if labs are normal today.

## 2021-10-13 LAB — SEDIMENTATION RATE: Sed Rate: 7 mm/hr (ref 0–30)

## 2021-10-13 LAB — URIC ACID: Uric Acid: 4.1 mg/dL (ref 3.8–8.4)

## 2021-10-13 LAB — C-REACTIVE PROTEIN: CRP: <1 mg/L (ref 0–10)

## 2021-10-14 ENCOUNTER — Other Ambulatory Visit: Payer: Self-pay | Admitting: Nurse Practitioner

## 2021-10-14 DIAGNOSIS — I10 Essential (primary) hypertension: Secondary | ICD-10-CM

## 2021-10-14 DIAGNOSIS — E785 Hyperlipidemia, unspecified: Secondary | ICD-10-CM

## 2021-10-14 DIAGNOSIS — K219 Gastro-esophageal reflux disease without esophagitis: Secondary | ICD-10-CM

## 2021-10-14 NOTE — Telephone Encounter (Signed)
Pt has new PCP.  Requested Prescriptions  Pending Prescriptions Disp Refills   omeprazole (PRILOSEC) 20 MG capsule [Pharmacy Med Name: OMEPRAZOLE DR 20 MG CAPSULE] 30 capsule 5    Sig: TAKE 1 CAPSULE BY MOUTH EVERY DAY     Gastroenterology: Proton Pump Inhibitors Passed - 10/14/2021  1:43 AM      Passed - Valid encounter within last 12 months    Recent Outpatient Visits          3 months ago Annual physical exam   [facility] Larae Grooms, NP   9 months ago Primary hypertension   [employer], Megan P, DO   10 months ago Immunization due   [facility] Gabriel Cirri, NP   1 year ago Obstructive sleep apnea syndrome   [facility] Gabriel Cirri, NP   1 year ago Essential hypertension   [facility] Particia Nearing, [place]      Future Appointments            In 1 month Paseda, Baird Kay, FNP [facility]            metoprolol succinate (TOPROL-XL) 100 MG 24 hr tablet [Pharmacy Med Name: METOPROLOL SUCC ER 100 MG TAB] 30 tablet 5    Sig: TAKE 1 TABLET BY MOUTH DAILY. TAKE WITH OR IMMEDIATELY FOLLOWING A MEAL.     Cardiovascular:  Beta Blockers Failed - 10/14/2021  1:43 AM      Failed - Last BP in normal range    BP Readings from Last 1 Encounters:  10/12/21 (!) 148/90         Passed - Last Heart Rate in normal range    Pulse Readings from Last 1 Encounters:  10/12/21 96         Passed - Valid encounter within last 6 months    Recent Outpatient Visits          3 months ago Annual physical exam   [facility] Larae Grooms, NP   9 months ago Primary hypertension   [facility] [place], [place], DO   10 months ago Immunization due   [facility] Gabriel Cirri, NP   1 year ago Obstructive sleep apnea syndrome   [facility] Gabriel Cirri, NP   1 year ago Essential hypertension   [facility] Particia N[place], [place]      Future Appointments            In 1 month Paseda, Baird Kay, FNP [facility]            rosuvastatin (CRESTOR) 20 MG tablet [Pharmacy Med Name: ROSUVASTATIN CALCIUM 20 MG TAB] 30 tablet 5    Sig: TAKE 1 TABLET BY MOUTH EVERY DAY     Cardiovascular:  Antilipid - Statins Failed - 10/14/2021  1:43 AM      Failed - Triglycerides in normal range and within 360 days    Triglycerides  Date Value Ref Range Status  07/13/2021 397 (H) 0 - 149 mg/dL Final   Triglycerides Piccolo,Waived  Date Value Ref Range Status  04/25/2019 381 (H) <150 mg/dL Final    Comment:                            Normal                   <150  Borderline High     150 - 199                         High                200 - 499                         Very High                >499          Passed - Total Cholesterol in normal range and within 360 days    Cholesterol, Total  Date Value Ref Range Status  07/13/2021 194 100 - 199 mg/dL Final   Cholesterol Piccolo, Waived  Date Value Ref Range Status  04/25/2019 169 <200 mg/dL Final    Comment:                            Desirable                <200                         Borderline High      200- 239                         High                     >239          Passed - LDL in normal range and within 360 days    LDL Chol Calc (NIH)  Date Value Ref Range Status  07/13/2021 80 0 - 99 mg/dL Final         Passed - HDL in normal range and within 360 days    HDL  Date Value Ref Range Status  07/13/2021 50 >39 mg/dL Final         Passed - Patient is not pregnant      Passed - Valid encounter within last 12 months    Recent Outpatient Visits          3 months ago Annual physical exam   [facility] Larae Grooms, NP   9 months ago Primary hypertension   [address] [place], [place], DO   10 months ago Immunization due   [employer], Elnita Maxwell, NP   1 year ago Obstructive sleep apnea syndrome   [facility] Gabriel Cirri, NP   1 year ago Essential hypertension   [facility] Particia Nearing, [place]      Future Appointments            In 1 month Paseda, Baird Kay, FNP [facility], [facility]

## 2021-10-19 ENCOUNTER — Other Ambulatory Visit: Payer: Self-pay | Admitting: *Deleted

## 2021-10-19 DIAGNOSIS — M25562 Pain in left knee: Secondary | ICD-10-CM

## 2021-10-24 ENCOUNTER — Other Ambulatory Visit: Payer: Self-pay | Admitting: Nurse Practitioner

## 2021-10-24 DIAGNOSIS — F339 Major depressive disorder, recurrent, unspecified: Secondary | ICD-10-CM

## 2021-10-29 DIAGNOSIS — G4733 Obstructive sleep apnea (adult) (pediatric): Secondary | ICD-10-CM | POA: Diagnosis not present

## 2021-11-03 ENCOUNTER — Other Ambulatory Visit: Payer: Self-pay | Admitting: Nurse Practitioner

## 2021-11-03 ENCOUNTER — Encounter: Payer: Self-pay | Admitting: Nurse Practitioner

## 2021-11-03 DIAGNOSIS — E785 Hyperlipidemia, unspecified: Secondary | ICD-10-CM

## 2021-11-03 DIAGNOSIS — K219 Gastro-esophageal reflux disease without esophagitis: Secondary | ICD-10-CM

## 2021-11-03 DIAGNOSIS — I10 Essential (primary) hypertension: Secondary | ICD-10-CM

## 2021-11-04 ENCOUNTER — Other Ambulatory Visit: Payer: Self-pay | Admitting: *Deleted

## 2021-11-04 DIAGNOSIS — I10 Essential (primary) hypertension: Secondary | ICD-10-CM

## 2021-11-04 DIAGNOSIS — K219 Gastro-esophageal reflux disease without esophagitis: Secondary | ICD-10-CM

## 2021-11-04 MED ORDER — METOPROLOL SUCCINATE ER 100 MG PO TB24: 100.0000 mg | ORAL_TABLET | Freq: Every day | ORAL | 1 refills | Status: DC

## 2021-11-04 MED ORDER — OMEPRAZOLE 20 MG PO CPDR: 20.0000 mg | DELAYED_RELEASE_CAPSULE | Freq: Every day | ORAL | 0 refills | Status: DC

## 2021-11-04 NOTE — Telephone Encounter (Signed)
Medication already signed 11/04/21.  Requested Prescriptions  Signed Prescriptions Disp Refills   rosuvastatin (CRESTOR) 20 MG tablet 30 tablet 0    Sig: TAKE 1 TABLET BY MOUTH EVERY DAY     Cardiovascular:  Antilipid - Statins Failed - 11/03/2021 12:44 PM      Failed - Triglycerides in normal range and within 360 days    Triglycerides  Date Value Ref Range Status  07/13/2021 397 (H) 0 - 149 mg/dL Final   Triglycerides Piccolo,Waived  Date Value Ref Range Status  04/25/2019 381 (H) <150 mg/dL Final    Comment:                            Normal                   <150                         Borderline High     150 - 199                         High                200 - 499                         Very High                >499          Passed - Total Cholesterol in normal range and within 360 days    Cholesterol, Total  Date Value Ref Range Status  07/13/2021 194 100 - 199 mg/dL Final   Cholesterol Piccolo, Waived  Date Value Ref Range Status  04/25/2019 169 <200 mg/dL Final    Comment:                            Desirable                <200                         Borderline High      200- 239                         High                     >239          Passed - LDL in normal range and within 360 days    LDL Chol Calc (NIH)  Date Value Ref Range Status  07/13/2021 80 0 - 99 mg/dL Final         Passed - HDL in normal range and within 360 days    HDL  Date Value Ref Range Status  07/13/2021 50 >39 mg/dL Final         Passed - Patient is not pregnant      Passed - Valid encounter within last 12 months    Recent Outpatient Visits          3 months ago Annual physical exam   [facility] Larae GroomsHoldsworth, Karen, NP   10 months ago Primary hypertension   [facility] [place], [place], [place]   11 months  ago Immunization due   [facility] Gabriel Cirri, NP   1 year ago Obstructive sleep apnea syndrome   [facility] Gabriel Cirri, NP   1 year ago Essential hypertension   [facility] Particia Nearing, [place]      Future Appointments            In 4 weeks Paseda, Baird Kay, FNP [facility]   In 1 month Wyline Mood, MD [place] [facility] [place]            hydrochlorothiazide (HYDRODIURIL) 25 MG tablet 30 tablet 0    Sig: TAKE 1 TABLET (25 MG TOTAL) BY MOUTH DAILY.     Cardiovascular: Diuretics - Thiazide Failed - 11/03/2021 12:44 PM      Failed - Last BP in normal range    BP Readings from Last 1 Encounters:  10/12/21 (!) 148/90         Passed - Ca in normal range and within 360 days    Calcium  Date Value Ref Range Status  07/13/2021 10.2 8.7 - 10.2 mg/dL Final         Passed - Cr in normal range and within 360 days    Creatinine, Ser  Date Value Ref Range Status  07/13/2021 1.00 0.76 - 1.27 mg/dL Final         Passed - K in normal range and within 360 days    Potassium  Date Value Ref Range Status  07/13/2021 3.7 3.5 - 5.2 mmol/L Final         Passed - Na in normal range and within 360 days    Sodium  Date Value Ref Range Status  07/13/2021 137 134 - 144 mmol/L Final         Passed - Valid encounter within last 6 months    Recent Outpatient Visits          3 months ago Annual physical exam   [facility] Larae Grooms, NP   10 months ago Primary hypertension   [address] [place], DO   11 months ago Immunization due   [facility], Elnita Maxwell, NP   1 year ago Obstructive sleep apnea syndrome   [facility] Gabriel Cirri, NP   1 year ago Essential hypertension   [facility] [place], Salley Hews, [place]      Future Appointments            In 4 weeks Paseda, Baird Kay, FNP [facility]   In 1 month Wyline Mood, MD [facility] [place]           Refused Prescriptions Disp Refills   metoprolol succinate (TOPROL-XL) 100 MG 24 hr tablet  [Pharmacy Med Name: METOPROLOL SUCC ER 100 MG TAB] 30 tablet 5    Sig: TAKE 1 TABLET BY MOUTH DAILY. TAKE WITH OR IMMEDIATELY FOLLOWING A MEAL.     Cardiovascular:  Beta Blockers Failed - 11/03/2021 12:44 PM      Failed - Last BP in normal range    BP Readings from Last 1 Encounters:  10/12/21 (!) 148/90         Passed - Last Heart Rate in normal range    Pulse Readings from Last 1 Encounters:  10/12/21 96         Passed - Valid encounter within last 6 months    Recent Outpatient Visits  3 months ago Annual physical exam   [facility] Larae Grooms, NP   10 months ago Primary hypertension   [facility] [place], [place], DO   11 months ago Immunization due   [facility] Gabriel Cirri, NP   1 year ago Obstructive sleep apnea syndrome   [facility] Gabriel Cirri, NP   1 year ago Essential hypertension   [facility] Particia Nearing, [place]      Future Appointments            In 4 weeks Paseda, Baird Kay, FNP [facility], [facility]   In 1 month Wyline Mood, MD [facility] [place]            omeprazole (PRILOSEC) 20 MG capsule [Pharmacy Med Name: OMEPRAZOLE DR 20 MG CAPSULE] 30 capsule 5    Sig: TAKE 1 CAPSULE BY MOUTH EVERY DAY     Gastroenterology: Proton Pump Inhibitors Passed - 11/03/2021 12:44 PM      Passed - Valid encounter within last 12 months    Recent Outpatient Visits          3 months ago Annual physical exam   [facility] Larae Grooms, NP   10 months ago Primary hypertension   [address], [place], DO   11 months ago Immunization due   [facility] Gabriel Cirri, NP   1 year ago Obstructive sleep apnea syndrome   [facility] Gabriel Cirri, NP   1 year ago Essential hypertension   [facility] Particia Nearing, [place]      Future Appointments            In 4 weeks Paseda,  Baird Kay, FNP [facility], [facility]   In 1 month Wyline Mood, MD  [facility] [place]

## 2021-11-04 NOTE — Telephone Encounter (Signed)
Requested medication (s) are due for refill today:   It looks like he has a new PCP as of 10/01/2021.  Requested medication (s) are on the active medication list:     Future visit scheduled:   No  He does with [facility] with Edwin Dada FNP   Last ordered:   Returned because wasn't sure whether to go ahead and refuse these or not.  Seen my Larae Grooms, NP recently.     Requested Prescriptions  Pending Prescriptions Disp Refills   rosuvastatin (CRESTOR) 20 MG tablet [Pharmacy Med Name: ROSUVASTATIN CALCIUM 20 MG TAB] 30 tablet 5    Sig: TAKE 1 TABLET BY MOUTH EVERY DAY     Cardiovascular:  Antilipid - Statins Failed - 11/03/2021 12:44 PM      Failed - Triglycerides in normal range and within 360 days    Triglycerides  Date Value Ref Range Status  07/13/2021 397 (H) 0 - 149 mg/dL Final   Triglycerides Piccolo,Waived  Date Value Ref Range Status  04/25/2019 381 (H) <150 mg/dL Final    Comment:                            Normal                   <150                         Borderline High     150 - 199                         High                200 - 499                         Very High                >499           Passed - Total Cholesterol in normal range and within 360 days    Cholesterol, Total  Date Value Ref Range Status  07/13/2021 194 100 - 199 mg/dL Final   Cholesterol Piccolo, Waived  Date Value Ref Range Status  04/25/2019 169 <200 mg/dL Final    Comment:                            Desirable                <200                         Borderline High      200- 239                         High                     >239           Passed - LDL in normal range and within 360 days    LDL Chol Calc (NIH)  Date Value Ref Range Status  07/13/2021 80 0 - 99 mg/dL Final          Passed - HDL in normal range and within 360 days    HDL  Date Value Ref Range Status  07/13/2021 50 >39 mg/dL Final          Passed - Patient is not  pregnant      Passed - Valid encounter within last 12 months    Recent Outpatient Visits           3 months ago Annual physical exam   [facility] Larae GroomsHoldsworth, Karen, NP   10 months ago Primary hypertension   [address] [place], [place], DO   11 months ago Immunization due   [facility] Gabriel CirriWicker, Cheryl, NP   1 year ago Obstructive sleep apnea syndrome   [facility] Gabriel CirriWicker, Cheryl, NP   1 year ago Essential hypertension   [facility] Particia NearingLane, Rachel [place], [place]       Future Appointments             In 4 weeks Paseda, Baird KayFolashade R, FNP [facility]   In 1 month Wyline MoodAnna, Kiran, MD [place] [facility] [place]             metoprolol succinate (TOPROL-XL) 100 MG 24 hr tablet [Pharmacy Med Name: METOPROLOL SUCC ER 100 MG TAB] 30 tablet 5    Sig: TAKE 1 TABLET BY MOUTH DAILY. TAKE WITH OR IMMEDIATELY FOLLOWING A MEAL.     Cardiovascular:  Beta Blockers Failed - 11/03/2021 12:44 PM      Failed - Last BP in normal range    BP Readings from Last 1 Encounters:  10/12/21 (!) 148/90          Passed - Last Heart Rate in normal range    Pulse Readings from Last 1 Encounters:  10/12/21 96          Passed - Valid encounter within last 6 months    Recent Outpatient Visits           3 months ago Annual physical exam   [facility] Larae GroomsHoldsworth, Karen, NP   10 months ago Primary hypertension   [address] [facility] [place], [place], [place]   11 months ago Immunization due   [facility] Gabriel CirriWicker, Cheryl, NP   1 year ago Obstructive sleep apnea syndrome   [facility] Gabriel CirriWicker, Cheryl, NP   1 year ago Essential hypertension   [facility] Particia NearingLane, Rachel Elizabeth, [place]       Future Appointments             In 4 weeks Paseda, Baird KayFolashade R, FNP [facility]   In 1 month Wyline MoodAnna, Kiran, MD [place] [facility] [place]             omeprazole (PRILOSEC) 20  MG capsule [Pharmacy Med Name: OMEPRAZOLE DR 20 MG CAPSULE] 30 capsule 5    Sig: TAKE 1 CAPSULE BY MOUTH EVERY DAY     Gastroenterology: Proton Pump Inhibitors Passed - 11/03/2021 12:44 PM      Passed - Valid encounter within last 12 months    Recent Outpatient Visits           3 months ago Annual physical exam   [facility] Larae GroomsHoldsworth, Karen, NP   10 months ago Primary hypertension   [facility] [place], [place] RoadMegan P, DO   11 months ago Immunization due   [facility] Gabriel CirriWicker, Cheryl, NP   1 year ago Obstructive sleep apnea syndrome   [facility] Gabriel CirriWicker, Cheryl, NP   1 year ago Essential hypertension   [facility] Particia Nearing, PA-C       Future Appointments             In 4 weeks Paseda, Baird Kay, FNP [facility], [facility]   In 1 month Wyline Mood, MD [place] [facility] [place]             hydrochlorothiazide (HYDRODIURIL) 25 MG tablet [Pharmacy Med Name: HYDROCHLOROTHIAZIDE 25 MG TAB] 30 tablet 2    Sig: Take 1 tablet (25 mg total) by mouth daily.     Cardiovascular: Diuretics - Thiazide Failed - 11/03/2021 12:44 PM      Failed - Last BP in normal range    BP Readings from Last 1 Encounters:  10/12/21 (!) 148/90          Passed - Ca in normal range and within 360 days    Calcium  Date Value Ref Range Status  07/13/2021 10.2 8.7 - 10.2 mg/dL Final          Passed - Cr in normal range and within 360 days    Creatinine, Ser  Date Value Ref Range Status  07/13/2021 1.00 0.76 - 1.27 mg/dL Final          Passed - K in normal range and within 360 days    Potassium  Date Value Ref Range Status  07/13/2021 3.7 3.5 - 5.2 mmol/L Final          Passed - Na in normal range and within 360 days    Sodium  Date Value Ref Range Status  07/13/2021 137 134 - 144 mmol/L Final          Passed - Valid encounter within last 6 months    Recent Outpatient Visits           3 months ago Annual  physical exam   [facility] Larae Grooms, NP   10 months ago Primary hypertension   [address] [place], DO   11 months ago Immunization due   [facility], Elnita Maxwell, NP   1 year ago Obstructive sleep apnea syndrome   [facility] Gabriel Cirri, NP   1 year ago Essential hypertension   [facility] Particia Nearing, [place]       Future Appointments             In 4 weeks Paseda, Baird Kay, FNP [facility], [facility]   In 1 month Wyline Mood, MD [place] [facility] [place]

## 2021-11-04 NOTE — Telephone Encounter (Signed)
Valid encounter . Will see new PCP in 4 weeks . Requested Prescriptions  Pending Prescriptions Disp Refills   rosuvastatin (CRESTOR) 20 MG tablet [Pharmacy Med Name: ROSUVASTATIN CALCIUM 20 MG TAB] 30 tablet 0    Sig: TAKE 1 TABLET BY MOUTH EVERY DAY     Cardiovascular:  Antilipid - Statins Failed - 11/03/2021 12:44 PM      Failed - Triglycerides in normal range and within 360 days    Triglycerides  Date Value Ref Range Status  07/13/2021 397 (H) 0 - 149 mg/dL Final   Triglycerides Piccolo,Waived  Date Value Ref Range Status  04/25/2019 381 (H) <150 mg/dL Final    Comment:                            Normal                   <150                         Borderline High     150 - 199                         High                200 - 499                         Very High                >499          Passed - Total Cholesterol in normal range and within 360 days    Cholesterol, Total  Date Value Ref Range Status  07/13/2021 194 100 - 199 mg/dL Final   Cholesterol Piccolo, Waived  Date Value Ref Range Status  04/25/2019 169 <200 mg/dL Final    Comment:                            Desirable                <200                         Borderline High      200- 239                         High                     >239          Passed - LDL in normal range and within 360 days    LDL Chol Calc (NIH)  Date Value Ref Range Status  07/13/2021 80 0 - 99 mg/dL Final         Passed - HDL in normal range and within 360 days    HDL  Date Value Ref Range Status  07/13/2021 50 >39 mg/dL Final         Passed - Patient is not pregnant      Passed - Valid encounter within last 12 months    Recent Outpatient Visits          3 months ago Annual physical exam   [facility] Larae Grooms, NP   10 months ago Primary  hypertension   [facility] [place], [place], [place]   11 months ago Immunization due   [facility] Gabriel CirriWicker, Cheryl, NP   1 year ago  Obstructive sleep apnea syndrome   [facility] Gabriel CirriWicker, Cheryl, NP   1 year ago Essential hypertension   [facility] Particia NearingLane, Rachel [place], [place]      Future Appointments            In 4 weeks Paseda, Baird KayFolashade R, FNP [facility], [facility]   In 1 month Wyline MoodAnna, Kiran, MD [place] [facility] [place]            metoprolol succinate (TOPROL-XL) 100 MG 24 hr tablet [Pharmacy Med Name: METOPROLOL SUCC ER 100 MG TAB] 30 tablet 5    Sig: TAKE 1 TABLET BY MOUTH DAILY. TAKE WITH OR IMMEDIATELY FOLLOWING A MEAL.     Cardiovascular:  Beta Blockers Failed - 11/03/2021 12:44 PM      Failed - Last BP in normal range    BP Readings from Last 1 Encounters:  10/12/21 (!) 148/90         Passed - Last Heart Rate in normal range    Pulse Readings from Last 1 Encounters:  10/12/21 96         Passed - Valid encounter within last 6 months    Recent Outpatient Visits          3 months ago Annual physical exam   [facility] Larae GroomsHoldsworth, Karen, NP   10 months ago Primary hypertension   [facility] [place], [place] LoopMegan P, [place]   11 months ago Immunization due   [facility] Gabriel CirriWicker, Cheryl, NP   1 year ago Obstructive sleep apnea syndrome   [facility] Gabriel CirriWicker, Cheryl, NP   1 year ago Essential hypertension   [facility] Particia NearingLane, Rachel Elizabeth, [place]      Future Appointments            In 4 weeks Paseda, Baird KayFolashade R, FNP [facility], [facility]   In 1 month Wyline MoodAnna, Kiran, MD [place] [facility] [place]            omeprazole (PRILOSEC) 20 MG capsule [Pharmacy Med Name: OMEPRAZOLE DR 20 MG CAPSULE] 30 capsule 5    Sig: TAKE 1 CAPSULE BY MOUTH EVERY DAY     Gastroenterology: Proton Pump Inhibitors Passed - 11/03/2021 12:44 PM      Passed - Valid encounter within last 12 months    Recent Outpatient Visits          3 months ago Annual physical exam   [facility] Larae GroomsHoldsworth, Karen, NP   10 months ago  Primary hypertension   [address] [place], [place], [place]   11 months ago Immunization due   [facility] Gabriel CirriWicker, Cheryl, NP   1 year ago Obstructive sleep apnea syndrome   [facility] Gabriel CirriWicker, Cheryl, NP   1 year ago Essential hypertension   [facility] [place], Salley Hewsachel [place], [place]      Future Appointments            In 4 weeks Paseda, Baird KayFolashade R, FNP [facility], [facility]   In 1 month Wyline MoodAnna, Kiran, MD  [facility] [place]            hydrochlorothiazide (HYDRODIURIL) 25 MG tablet [Pharmacy Med Name: HYDROCHLOROTHIAZIDE 25 MG TAB] 30 tablet 0    Sig: TAKE 1 TABLET (25 MG TOTAL) BY MOUTH DAILY.  Cardiovascular: Diuretics - Thiazide Failed - 11/03/2021 12:44 PM      Failed - Last BP in normal range    BP Readings from Last 1 Encounters:  10/12/21 (!) 148/90         Passed - Ca in normal range and within 360 days    Calcium  Date Value Ref Range Status  07/13/2021 10.2 8.7 - 10.2 mg/dL Final         Passed - Cr in normal range and within 360 days    Creatinine, Ser  Date Value Ref Range Status  07/13/2021 1.00 0.76 - 1.27 mg/dL Final         Passed - K in normal range and within 360 days    Potassium  Date Value Ref Range Status  07/13/2021 3.7 3.5 - 5.2 mmol/L Final         Passed - Na in normal range and within 360 days    Sodium  Date Value Ref Range Status  07/13/2021 137 134 - 144 mmol/L Final         Passed - Valid encounter within last 6 months    Recent Outpatient Visits          3 months ago Annual physical exam   [facility] Larae Grooms, NP   10 months ago Primary hypertension   [address] [place], [place]   11 months ago Immunization due   [facility], Elnita Maxwell, NP   1 year ago Obstructive sleep apnea syndrome   [facility] Gabriel Cirri, NP   1 year ago Essential hypertension   [facility] Particia Nearing, [place]      Future Appointments            In 4 weeks Paseda, Baird Kay, FNP [facility], [facility]   In 1 month Wyline Mood, MD [facility] [place]

## 2021-11-09 ENCOUNTER — Other Ambulatory Visit: Payer: Self-pay | Admitting: *Deleted

## 2021-11-09 DIAGNOSIS — M25562 Pain in left knee: Secondary | ICD-10-CM

## 2021-11-11 ENCOUNTER — Encounter: Payer: Self-pay | Admitting: Gastroenterology

## 2021-11-11 ENCOUNTER — Ambulatory Visit (INDEPENDENT_AMBULATORY_CARE_PROVIDER_SITE_OTHER): Payer: BC Managed Care – PPO | Admitting: Gastroenterology

## 2021-11-11 ENCOUNTER — Other Ambulatory Visit: Payer: Self-pay

## 2021-11-11 VITALS — BP 155/91 | HR 87 | Temp 99.1°F | Ht 72.0 in | Wt 227.4 lb

## 2021-11-11 DIAGNOSIS — R197 Diarrhea, unspecified: Secondary | ICD-10-CM

## 2021-11-11 DIAGNOSIS — R1084 Generalized abdominal pain: Secondary | ICD-10-CM | POA: Diagnosis not present

## 2021-11-11 DIAGNOSIS — R0982 Postnasal drip: Secondary | ICD-10-CM | POA: Diagnosis not present

## 2021-11-11 DIAGNOSIS — K219 Gastro-esophageal reflux disease without esophagitis: Secondary | ICD-10-CM

## 2021-11-11 NOTE — Progress Notes (Signed)
David Bellows MD, MRCP(U.K) [address]  [place], [place] [address]  Main: [phone]  Fax: [phone]   Gastroenterology Consultation  Referring Provider:     No ref. provider found Primary Care Physician:  Renee Rival, FNP Primary Gastroenterologist:  Dr. Jonathon Barrera  Reason for Consultation:     Abdominal pain        HPI:   David Barrera is a 56 y.o. y/o male referred for consultation & management  by Renee Rival, FNP.    07/13/2021 hemoglobin 13.9 g TSH and CMP showed no gross abnormalities He has been referred for chronic abdominal pain.  06/03/2020 underwent a colonoscopy that showed diverticulosis of the entire colon and internal hemorrhoids  He is here today to see me for heartburn which he says he has had on and off for a long time.  Has been taking omeprazole but takes it with his food.  He wakes up in the morning but burning sensation in his chest.  Denies any NSAID use.  He also has occasional epigastric pain.  He states that he has had softer stools for many years 3 bowel movements pudding consistency.  Some gas and bloating.  Denies any use of artificial sugars or sweeteners.  Gained weight.  Complains of a postnasal drip and drainage.  With that he has a chronic cough.  He wonders if he has any allergies.  No other complaints.  Past Medical History:  Diagnosis Date   Allergy    GERD (gastroesophageal reflux disease)    Hyperlipidemia    Hypertension     Past Surgical History:  Procedure Laterality Date   COLONOSCOPY WITH PROPOFOL     COLONOSCOPY WITH PROPOFOL N/A 06/03/2020   Procedure: COLONOSCOPY WITH PROPOFOL;  Surgeon: Lucilla Lame, MD;  Location: [facility] ENDOSCOPY;  Service: Endoscopy;  Laterality: N/A;   VASECTOMY      Prior to Admission medications   Medication Sig Start Date End Date Taking? Authorizing Provider  allopurinol (ZYLOPRIM) 100 MG tablet Take 1 tablet (100 mg total) by mouth daily. 07/24/21   Jon Billings,  NP  amLODipine (NORVASC) 2.5 MG tablet Take 1 tablet (2.5 mg total) by mouth daily. 10/01/21   Paseda, Dewaine Conger, FNP  amLODipine (NORVASC) 5 MG tablet Take 1 tablet (5 mg total) by mouth daily. 10/01/21   Renee Rival, FNP  aspirin EC 81 MG tablet Take 81 mg by mouth daily.    [provider]  cetirizine (ZYRTEC) 10 MG tablet Take 1 tablet (10 mg total) by mouth daily. 10/12/21   Renee Rival, FNP  Choline Fenofibrate (FENOFIBRIC ACID) 135 MG CPDR Take 1 tablet by mouth daily. 07/13/21   Jon Billings, NP  fluticasone (FLONASE) 50 MCG/ACT nasal spray Place 2 sprays into both nostrils daily. 10/12/21   Paseda, Dewaine Conger, FNP  hydrochlorothiazide (HYDRODIURIL) 25 MG tablet TAKE 1 TABLET (25 MG TOTAL) BY MOUTH DAILY. 11/04/21   Jon Billings, NP  ibuprofen (ADVIL) 600 MG tablet Take 1 tablet (600 mg total) by mouth every 8 (eight) hours as needed. 10/12/21   Renee Rival, FNP  loperamide (IMODIUM A-D) 2 MG tablet Take 1 tablet (2 mg total) by mouth 4 (four) times daily as needed for diarrhea or loose stools. 10/01/21   Renee Rival, FNP  metoprolol succinate (TOPROL-XL) 100 MG 24 hr tablet Take 1 tablet (100 mg total) by mouth daily. TAKE WITH OR IMMEDIATELY FOLLOWING A MEAL. 11/04/21  Renee Rival, FNP  omeprazole (PRILOSEC) 20 MG capsule Take 1 capsule (20 mg total) by mouth daily. 11/04/21   Renee Rival, FNP  rosuvastatin (CRESTOR) 20 MG tablet TAKE 1 TABLET BY MOUTH EVERY DAY 11/04/21   Jon Billings, NP  sildenafil (REVATIO) 20 MG tablet Take 1-5 tablets prn 07/13/21   Jon Billings, NP  venlafaxine XR (EFFEXOR-XR) 37.5 MG 24 hr capsule TAKE 1 CAPSULE BY MOUTH DAILY WITH BREAKFAST. 10/27/21   Renee Rival, FNP    Family History  Problem Relation Age of Onset   Cancer Sister        CA in the lymph node   Diabetes Sister    Diabetes Brother      Social History   Tobacco Use   Smoking status: Never   Smokeless tobacco:  Never  Vaping Use   Vaping Use: Never used  Substance Use Topics   Alcohol use: Yes    Alcohol/week: 25.0 standard drinks    Types: 25 Shots of liquor per week    Comment: 2 or 3 drink of liquor a night   Drug use: No    Allergies as of 11/11/2021 - Review Complete 11/11/2021  Allergen Reaction Noted   Benazepril hcl Cough 10/12/2016   Lipitor [atorvastatin] Other (See Comments) 08/05/2016    Review of Systems:    All systems reviewed and negative except where noted in HPI.   Physical Exam:  BP (!) 155/91 (BP Location: Left Arm, Patient Position: Sitting, Cuff Size: Large)    Pulse 87    Temp 99.1 F (37.3 C) (Oral)    Ht 6' (1.829 m)    Wt 227 lb 6.4 oz (103.1 kg)    BMI 30.84 kg/m  No LMP for male patient. Psych:  Alert and cooperative. Normal mood and affect. General:   Alert,  Well-developed, well-nourished, pleasant and cooperative in NAD Head:  Normocephalic and atraumatic. Eyes:  Sclera clear, no icterus.   Conjunctiva pink. Ears:  Normal auditory acuity. Neurologic:  Alert and oriented x3;  grossly normal neurologically.Marland Kitchen Psych:  Alert and cooperative. Normal mood and affect.  Imaging Studies: No results found.  Assessment and Plan:   David Barrera is a 56 y.o. y/o male states he is here today to see me to discuss about heartburn which appears to be symptoms suggestive of acid reflux.  He has been taking omeprazole but unfortunately been taking it with food which is not the correct way of taking it as it Is to Be Taken on Empty Stomach to Work Effectively.  He Complains of a chronic postnasal drip which leads to a cough and bringing out a lot of phlegm in the morning.  I wonder if he has drainage from the sinuses chronic sinusitis or allergies.  He has a bowel movement 2-3 times a day soft in consistency a lot of gas and bloating going on for many years.  Wonders if the complaint of irritable bowel syndrome.   Plan 1.  Start omeprazole 40 mg a day taking it first  thing in the morning on an empty stomach.  Counseled about lifestyle changes of acid reflux.  Suggested to use a wedge pillow.  Avoid eating for 2 hours before bedtime.  Advised to lose weight.  At his next visit we will try to reduce the dose of omeprazole.Patient information provided   2.  H. pylori breath test  3.  Advised to avoid all artificial sugars and sweeteners to avoid an osmotic  diarrhea.  Trial of Creon for 1 week samples will be provided.  Celiac serology will be checked.  4.  I will refer him to ENT for postnasal drainage  Follow up in 8 weeks   Dr David Bellows MD,MRCP(U.K)

## 2021-11-11 NOTE — Patient Instructions (Signed)

## 2021-11-11 NOTE — Addendum Note (Signed)
Addended by: Adela Ports on: 11/11/2021 01:24 PM   Modules accepted: Orders

## 2021-11-12 LAB — H. PYLORI BREATH TEST: H pylori Breath Test: NEGATIVE

## 2021-11-13 ENCOUNTER — Encounter: Payer: Self-pay | Admitting: Nurse Practitioner

## 2021-11-13 LAB — CELIAC DISEASE PANEL
Endomysial IgA: NEGATIVE
IgA/Immunoglobulin A, Serum: 84 mg/dL — ABNORMAL LOW (ref 90–386)
Transglutaminase IgA: <2 U/mL (ref 0–3)

## 2021-11-13 LAB — TISSUE TRANSGLUTAMINASE, IGG: Tissue Transglut Ab: <2 U/mL (ref 0–5)

## 2021-11-16 NOTE — Telephone Encounter (Signed)
GI will discuss findings with pt. Thanks

## 2021-11-17 ENCOUNTER — Other Ambulatory Visit: Payer: Self-pay

## 2021-11-17 ENCOUNTER — Ambulatory Visit: Payer: BC Managed Care – PPO | Admitting: Orthopedic Surgery

## 2021-11-17 ENCOUNTER — Ambulatory Visit: Payer: BC Managed Care – PPO

## 2021-11-17 ENCOUNTER — Encounter: Payer: Self-pay | Admitting: Orthopedic Surgery

## 2021-11-17 VITALS — BP 153/106 | HR 111 | Ht 72.0 in | Wt 225.0 lb

## 2021-11-17 DIAGNOSIS — M1712 Unilateral primary osteoarthritis, left knee: Secondary | ICD-10-CM | POA: Diagnosis not present

## 2021-11-17 DIAGNOSIS — M25562 Pain in left knee: Secondary | ICD-10-CM

## 2021-11-17 NOTE — Patient Instructions (Signed)

## 2021-11-17 NOTE — Progress Notes (Signed)
New Patient Visit  Assessment: David Barrera is a 56 y.o. male with the following: 1. Arthritis of left knee; mild, currently with few symptoms  Plan: Patient has had pain in his left knee, primarily within the medial aspect for the past 1-2 months.  No prior injury.  Reviewed radiographs with the patient which demonstrates mild arthritis overall.  He has mild loss of joint space within the medial compartment.  He also has some small osteophytes, and maltracking of the patella based on the sunrise view.  We discussed multiple treatment options, including ice, over-the-counter pain medications, topical medications, a brace as well as an injection.  We could consider physical therapy.  Based on his current symptoms, I do not think that an injection is warranted at this time.  He would like to continue with topical medications, and possibly consider a brace.  I think this is reasonable.  If his pain worsens, we can consider an injection in the future.  I do not think a surgical intervention will be beneficial, and his symptoms and radiographs are not close to consideration for a knee replacement.   Follow-up: Return if symptoms worsen or fail to improve.  Subjective:  Chief Complaint  Patient presents with   Knee Pain    LT/ hurting x 1-1.5 mths No known injury    History of Present Illness: David Barrera is a 56 y.o. male who has been referred to my Vena Rua, FNP for evaluation of left knee pain.  He has had pain in his left knee for the past 1-2 months.  He states he first noted pain in the medial aspect of the left knee after going to a wedding.  No specific injury.  No twisting of his left knee.  He noted a mild amount of swelling.  He has never had an injury in his left knee before.  He has been using some Bengay, with some improvement in his symptoms.  He is not taking any medications.  He has never had an injection.  He is not working with a physical therapist.  He is on his feet  at work all day, but not necessarily doing a lot of lifting or twisting.  He denies mechanical symptoms in his left knee.  No buckling.  His pain is not worsening.   Review of Systems: No fevers or chills No numbness or tingling No chest pain No shortness of breath No bowel or bladder dysfunction No GI distress No headaches   Medical History:  Past Medical History:  Diagnosis Date   Allergy    GERD (gastroesophageal reflux disease)    Hyperlipidemia    Hypertension     Past Surgical History:  Procedure Laterality Date   COLONOSCOPY WITH PROPOFOL     COLONOSCOPY WITH PROPOFOL N/A 06/03/2020   Procedure: COLONOSCOPY WITH PROPOFOL;  Surgeon: Lucilla Lame, MD;  Location: [facility] ENDOSCOPY;  Service: Endoscopy;  Laterality: N/A;   VASECTOMY      Family History  Problem Relation Age of Onset   Cancer Sister        CA in the lymph node   Diabetes Sister    Diabetes Brother    Social History   Tobacco Use   Smoking status: Never   Smokeless tobacco: Never  Vaping Use   Vaping Use: Never used  Substance Use Topics   Alcohol use: Yes    Alcohol/week: 25.0 standard drinks    Types: 25 Shots of liquor per week  Comment: 2 or 3 drink of liquor a night   Drug use: No    Allergies  Allergen Reactions   Benazepril Hcl Cough   Lipitor [Atorvastatin] Other (See Comments)    Myalgia leg pain    Current Meds  Medication Sig   allopurinol (ZYLOPRIM) 100 MG tablet Take 1 tablet (100 mg total) by mouth daily.   amLODipine (NORVASC) 2.5 MG tablet Take 1 tablet (2.5 mg total) by mouth daily.   amLODipine (NORVASC) 5 MG tablet Take 1 tablet (5 mg total) by mouth daily.   aspirin EC 81 MG tablet Take 81 mg by mouth daily.   cetirizine (ZYRTEC) 10 MG tablet Take 1 tablet (10 mg total) by mouth daily.   Choline Fenofibrate (FENOFIBRIC ACID) 135 MG CPDR Take 1 tablet by mouth daily.   fluticasone (FLONASE) 50 MCG/ACT nasal spray Place 2 sprays into both nostrils daily.    hydrochlorothiazide (HYDRODIURIL) 25 MG tablet TAKE 1 TABLET (25 MG TOTAL) BY MOUTH DAILY.   ibuprofen (ADVIL) 600 MG tablet Take 1 tablet (600 mg total) by mouth every 8 (eight) hours as needed.   loperamide (IMODIUM A-D) 2 MG tablet Take 1 tablet (2 mg total) by mouth 4 (four) times daily as needed for diarrhea or loose stools.   metoprolol succinate (TOPROL-XL) 100 MG 24 hr tablet Take 1 tablet (100 mg total) by mouth daily. TAKE WITH OR IMMEDIATELY FOLLOWING A MEAL.   omeprazole (PRILOSEC) 20 MG capsule Take 1 capsule (20 mg total) by mouth daily.   rosuvastatin (CRESTOR) 20 MG tablet TAKE 1 TABLET BY MOUTH EVERY DAY   sildenafil (REVATIO) 20 MG tablet Take 1-5 tablets prn   venlafaxine XR (EFFEXOR-XR) 37.5 MG 24 hr capsule TAKE 1 CAPSULE BY MOUTH DAILY WITH BREAKFAST.    Objective: BP (!) 153/106    Pulse (!) 111    Ht 6' (1.829 m)    Wt 225 lb (102.1 kg)    BMI 30.52 kg/m   Physical Exam:  General: Alert and oriented. and No acute distress. Gait: Normal gait.  Evaluation left knee demonstrates a mild varus alignment overall.  He is able to achieve full extension.  He is excellent muscular strength of the quadriceps.  Flexion beyond 120 degrees with mild discomfort.  Mild tenderness to palpation along the medial joint line.  Negative McMurray's.  Negative Lachman.  No increased laxity varus or valgus stress.  Some crepitus is appreciated with range of motion testing.    IMAGING: I personally ordered and reviewed the following images  X-rays of the left knee were obtained in clinic today.  No acute injuries are noted.  There is mild varus alignment overall.  Mild loss of joint space within the medial compartment.  The patella is tilted laterally.  Small osteophytes are noted.  Impression: Mild left knee arthritis, most prominent within the medial compartment.   New Medications:  No orders of the defined types were placed in this encounter.     Mordecai Rasmussen,  MD  11/17/2021 9:25 AM

## 2021-11-20 ENCOUNTER — Telehealth: Payer: Self-pay

## 2021-11-20 DIAGNOSIS — R1084 Generalized abdominal pain: Secondary | ICD-10-CM

## 2021-11-20 DIAGNOSIS — R197 Diarrhea, unspecified: Secondary | ICD-10-CM

## 2021-11-20 NOTE — Telephone Encounter (Signed)
Called patient to let him know the below information. Patient stated that he will be going to [facility] to have the additional labs drawn. Lab order was also faxed to [phone].

## 2021-11-20 NOTE — Telephone Encounter (Signed)
-----   Message from Wyline Mood, MD sent at 11/18/2021  8:13 AM EST ----- Inform IGA levels borderline low so will need to check TTG IGG in his case to exclude celiac disease - please order

## 2021-11-29 DIAGNOSIS — G4733 Obstructive sleep apnea (adult) (pediatric): Secondary | ICD-10-CM | POA: Diagnosis not present

## 2021-12-03 ENCOUNTER — Ambulatory Visit: Payer: BC Managed Care – PPO | Admitting: Nurse Practitioner

## 2021-12-03 ENCOUNTER — Other Ambulatory Visit: Payer: Self-pay | Admitting: Nurse Practitioner

## 2021-12-03 DIAGNOSIS — I10 Essential (primary) hypertension: Secondary | ICD-10-CM

## 2021-12-03 DIAGNOSIS — E785 Hyperlipidemia, unspecified: Secondary | ICD-10-CM

## 2021-12-03 NOTE — Telephone Encounter (Signed)
Requested medication (s) are due for refill today:   Requested medication (s) are on the active medication list: YES  Last refill:  11/04/21  Future visit scheduled: No  Notes to clinic:  Is patient still seen at the practice?    Requested Prescriptions  Pending Prescriptions Disp Refills   rosuvastatin (CRESTOR) 20 MG tablet [Pharmacy Med Name: ROSUVASTATIN CALCIUM 20 MG TAB] 30 tablet 0    Sig: TAKE 1 TABLET BY MOUTH EVERY DAY     Cardiovascular:  Antilipid - Statins 2 Failed - 12/03/2021 10:31 AM      Failed - Lipid Panel in normal range within the last 12 months    Cholesterol, Total  Date Value Ref Range Status  07/13/2021 194 100 - 199 mg/dL Final   Cholesterol Piccolo, Waived  Date Value Ref Range Status  04/25/2019 169 <200 mg/dL Final    Comment:                            Desirable                <200                         Borderline High      200- 239                         High                     >239    LDL Chol Calc (NIH)  Date Value Ref Range Status  07/13/2021 80 0 - 99 mg/dL Final   HDL  Date Value Ref Range Status  07/13/2021 50 >39 mg/dL Final   Triglycerides  Date Value Ref Range Status  07/13/2021 397 (H) 0 - 149 mg/dL Final   Triglycerides Piccolo,Waived  Date Value Ref Range Status  04/25/2019 381 (H) <150 mg/dL Final    Comment:                            Normal                   <150                         Borderline High     150 - 199                         High                200 - 499                         Very High                >499          Passed - Cr in normal range and within 360 days    Creatinine, Ser  Date Value Ref Range Status  07/13/2021 1.00 0.76 - 1.27 mg/dL Final          Passed - Patient is not pregnant      Passed - Valid encounter within last 12 months    Recent Outpatient Visits           4 months  ago Annual physical exam   [facility] Larae Grooms, NP   11 months ago Primary  hypertension   [facility] [place], [place], DO   1 year ago Immunization due   [facility] Gabriel Cirri, NP   1 year ago Obstructive sleep apnea syndrome   [facility] Gabriel Cirri, NP   1 year ago Essential hypertension   [facility] Particia Nearing, [place]       Future Appointments             In 1 month Wyline Mood, MD [facility] [place]             hydrochlorothiazide (HYDRODIURIL) 25 MG tablet [Pharmacy Med Name: HYDROCHLOROTHIAZIDE 25 MG TAB] 30 tablet 0    Sig: TAKE 1 TABLET (25 MG TOTAL) BY MOUTH DAILY.     Cardiovascular: Diuretics - Thiazide Failed - 12/03/2021 10:31 AM      Failed - Last BP in normal range    BP Readings from Last 1 Encounters:  11/17/21 (!) 153/106          Passed - Cr in normal range and within 180 days    Creatinine, Ser  Date Value Ref Range Status  07/13/2021 1.00 0.76 - 1.27 mg/dL Final          Passed - K in normal range and within 180 days    Potassium  Date Value Ref Range Status  07/13/2021 3.7 3.5 - 5.2 mmol/L Final          Passed - Na in normal range and within 180 days    Sodium  Date Value Ref Range Status  07/13/2021 137 134 - 144 mmol/L Final          Passed - Valid encounter within last 6 months    Recent Outpatient Visits           4 months ago Annual physical exam   [facility] Larae Grooms, NP   11 months ago Primary hypertension   [facility] [place], [place], DO   1 year ago Immunization due   [facility] Gabriel Cirri, NP   1 year ago Obstructive sleep apnea syndrome   [facility] Gabriel Cirri, NP   1 year ago Essential hypertension   [facility] Particia Nearing, [place]       Future Appointments             In 1 month Wyline Mood, MD [facility] [place]

## 2021-12-03 NOTE — Telephone Encounter (Signed)
Pt. Not under pre scriber care. Requested Prescriptions  Refused Prescriptions Disp Refills   rosuvastatin (CRESTOR) 20 MG tablet [Pharmacy Med Name: ROSUVASTATIN CALCIUM 20 MG TAB] 30 tablet 0    Sig: TAKE 1 TABLET BY MOUTH EVERY DAY     Cardiovascular:  Antilipid - Statins 2 Failed - 12/03/2021 10:31 AM      Failed - Lipid Panel in normal range within the last 12 months    Cholesterol, Total  Date Value Ref Range Status  07/13/2021 194 100 - 199 mg/dL Final   Cholesterol Piccolo, Waived  Date Value Ref Range Status  04/25/2019 169 <200 mg/dL Final    Comment:                            Desirable                <200                         Borderline High      200- 239                         High                     >239    LDL Chol Calc (NIH)  Date Value Ref Range Status  07/13/2021 80 0 - 99 mg/dL Final   HDL  Date Value Ref Range Status  07/13/2021 50 >39 mg/dL Final   Triglycerides  Date Value Ref Range Status  07/13/2021 397 (H) 0 - 149 mg/dL Final   Triglycerides Piccolo,Waived  Date Value Ref Range Status  04/25/2019 381 (H) <150 mg/dL Final    Comment:                            Normal                   <150                         Borderline High     150 - 199                         High                200 - 499                         Very High                >499          Passed - Cr in normal range and within 360 days    Creatinine, Ser  Date Value Ref Range Status  07/13/2021 1.00 0.76 - 1.27 mg/dL Final         Passed - Patient is not pregnant      Passed - Valid encounter within last 12 months    Recent Outpatient Visits          4 months ago Annual physical exam   [facility] Jon Billings, NP   11 months ago Primary hypertension   [place], [place], DO   1 year ago Immunization due   [employer],  Malachy Mood, NP   1 year ago Obstructive sleep apnea syndrome   [facility] Kathrine Haddock, NP   1 year ago Essential hypertension   [facility] [place], [place]      Future Appointments            In 1 month Jonathon Bellows, MD [place]            hydrochlorothiazide (HYDRODIURIL) 25 MG tablet [Pharmacy Med Name: HYDROCHLOROTHIAZIDE 25 MG TAB] 30 tablet 0    Sig: TAKE 1 TABLET (25 MG TOTAL) BY MOUTH DAILY.     Cardiovascular: Diuretics - Thiazide Failed - 12/03/2021 10:31 AM      Failed - Last BP in normal range    BP Readings from Last 1 Encounters:  11/17/21 (!) 153/106         Passed - Cr in normal range and within 180 days    Creatinine, Ser  Date Value Ref Range Status  07/13/2021 1.00 0.76 - 1.27 mg/dL Final         Passed - K in normal range and within 180 days    Potassium  Date Value Ref Range Status  07/13/2021 3.7 3.5 - 5.2 mmol/L Final         Passed - Na in normal range and within 180 days    Sodium  Date Value Ref Range Status  07/13/2021 137 134 - 144 mmol/L Final         Passed - Valid encounter within last 6 months    Recent Outpatient Visits          4 months ago Annual physical exam   [place], NP   11 months ago Primary hypertension   [place], [place], DO   1 year ago Immunization due   [place], NP   1 year ago Obstructive sleep apnea syndrome   [facility] Kathrine Haddock, NP   1 year ago Essential hypertension   [facility] [place], [place]      Future Appointments            In 1 month Jonathon Bellows, MD [place]

## 2021-12-03 NOTE — Telephone Encounter (Signed)
Pt no longer being seen at this office.

## 2021-12-10 ENCOUNTER — Ambulatory Visit: Payer: BC Managed Care – PPO | Admitting: Gastroenterology

## 2021-12-16 DIAGNOSIS — E785 Hyperlipidemia, unspecified: Secondary | ICD-10-CM | POA: Diagnosis not present

## 2021-12-16 DIAGNOSIS — M1A00X0 Idiopathic chronic gout, unspecified site, without tophus (tophi): Secondary | ICD-10-CM | POA: Diagnosis not present

## 2021-12-16 DIAGNOSIS — R197 Diarrhea, unspecified: Secondary | ICD-10-CM | POA: Diagnosis not present

## 2021-12-16 DIAGNOSIS — I10 Essential (primary) hypertension: Secondary | ICD-10-CM | POA: Diagnosis not present

## 2021-12-16 DIAGNOSIS — R1084 Generalized abdominal pain: Secondary | ICD-10-CM | POA: Diagnosis not present

## 2021-12-17 LAB — CMP14+EGFR
ALT: 22 IU/L (ref 0–44)
AST: 35 IU/L (ref 0–40)
Albumin/Globulin Ratio: 1.8 (ref 1.2–2.2)
Albumin: 4.6 g/dL (ref 3.8–4.9)
Alkaline Phosphatase: 41 IU/L — ABNORMAL LOW (ref 44–121)
BUN/Creatinine Ratio: 21 — ABNORMAL HIGH (ref 9–20)
BUN: 18 mg/dL (ref 6–24)
Bilirubin Total: 0.3 mg/dL (ref 0.0–1.2)
CO2: 19 mmol/L — ABNORMAL LOW (ref 20–29)
Calcium: 9.6 mg/dL (ref 8.7–10.2)
Chloride: 102 mmol/L (ref 96–106)
Creatinine, Ser: 0.86 mg/dL (ref 0.76–1.27)
Globulin, Total: 2.5 g/dL (ref 1.5–4.5)
Glucose: 155 mg/dL — ABNORMAL HIGH (ref 70–99)
Potassium: 3.7 mmol/L (ref 3.5–5.2)
Sodium: 142 mmol/L (ref 134–144)
Total Protein: 7.1 g/dL (ref 6.0–8.5)
eGFR: 101 mL/min/{1.73_m2} (ref >59)

## 2021-12-17 LAB — LIPID PANEL
Chol/HDL Ratio: 4.1 ratio (ref 0.0–5.0)
Cholesterol, Total: 189 mg/dL (ref 100–199)
HDL: 46 mg/dL (ref >39)
LDL Chol Calc (NIH): 83 mg/dL (ref 0–99)
Triglycerides: 373 mg/dL — ABNORMAL HIGH (ref 0–149)
VLDL Cholesterol Cal: 60 mg/dL — ABNORMAL HIGH (ref 5–40)

## 2021-12-17 LAB — URIC ACID: Uric Acid: 4.1 mg/dL (ref 3.8–8.4)

## 2021-12-17 NOTE — Progress Notes (Signed)
Please review labs with patient,  Triglycerides is elevated,, avoid alcohol high fructose foods and reduce, continue current medications  Thank you

## 2021-12-18 LAB — CELIAC AB TTG DGP TIGA
Antigliadin Abs, IgA: 3 units (ref 0–19)
Gliadin IgG: 3 units (ref 0–19)
IgA/Immunoglobulin A, Serum: 79 mg/dL — ABNORMAL LOW (ref 90–386)
Tissue Transglut Ab: <2 U/mL (ref 0–5)
Transglutaminase IgA: <2 U/mL (ref 0–3)

## 2021-12-18 LAB — IGG: IgG (Immunoglobin G), Serum: 772 mg/dL (ref 603–1613)

## 2021-12-29 DIAGNOSIS — G4733 Obstructive sleep apnea (adult) (pediatric): Secondary | ICD-10-CM | POA: Diagnosis not present

## 2022-01-04 ENCOUNTER — Other Ambulatory Visit: Payer: Self-pay | Admitting: Nurse Practitioner

## 2022-01-05 NOTE — Telephone Encounter (Signed)
Requested medication (s) are due for refill today: Yes ? ?Requested medication (s) are on the active medication list: Yes ? ?Last refill:  07/13/21 ? ?Future visit scheduled: No ? ?Notes to clinic:  is pt. Still seen at the practice? ? ? ? ?Requested Prescriptions  ?Pending Prescriptions Disp Refills  ? Choline Fenofibrate (FENOFIBRIC ACID) 135 MG CPDR [Pharmacy Med Name: FENOFIBRIC ACID DR 135 MG CAP] 30 capsule 5  ?  Sig: TAKE 1 CAPSULE BY MOUTH EVERY DAY  ?  ? Cardiovascular:  Antilipid - Fibric Acid Derivatives Failed - 01/04/2022  1:19 PM  ?  ?  Failed - Lipid Panel in normal range within the last 12 months  ?  Cholesterol, Total  ?Date Value Ref Range Status  ?12/16/2021 189 100 - 199 mg/dL Final  ? ?Cholesterol [place], [place]  ?Date Value Ref Range Status  ?04/25/2019 169 <200 mg/dL Final  ?  Comment:  ?                          Desirable                <200 ?                        Borderline High      200- 239 ?                        High                     >239 ?  ? ?LDL Chol Calc (NIH)  ?Date Value Ref Range Status  ?12/16/2021 83 0 - 99 mg/dL Final  ? ?HDL  ?Date Value Ref Range Status  ?12/16/2021 46 >39 mg/dL Final  ? ?Triglycerides  ?Date Value Ref Range Status  ?12/16/2021 373 (H) 0 - 149 mg/dL Final  ? ?Triglycerides Piccolo,Waived  ?Date Value Ref Range Status  ?04/25/2019 381 (H) <150 mg/dL Final  ?  Comment:  ?                          Normal                   <150 ?                        Borderline High     150 - 199 ?                        High                200 - 499 ?                        Very High                >499 ?  ? ?  ?  ?  Passed - ALT in normal range and within 360 days  ?  ALT  ?Date Value Ref Range Status  ?12/16/2021 22 0 - 44 IU/L Final  ? ?ALT (SGPT) Piccolo, Waived  ?Date Value Ref Range Status  ?04/25/2019 28 10 - 47 U/L Final  ?  ?  ?  ?  Passed - AST in normal range and within 360 days  ?  AST  ?Date Value Ref Range Status  ?12/16/2021 35 0 - 40 IU/L Final  ? ?AST  (SGOT) Piccolo, Waived  ?Date Value Ref Range Status  ?04/25/2019 32 11 - 38 U/L Final  ?  ?  ?  ?  Passed - Cr in normal range and within 360 days  ?  Creatinine, Ser  ?Date Value Ref Range Status  ?12/16/2021 0.86 0.76 - 1.27 mg/dL Final  ?  ?  ?  ?  Passed - HGB in normal range and within 360 days  ?  Hemoglobin  ?Date Value Ref Range Status  ?07/13/2021 13.9 13.0 - 17.7 g/dL Final  ?  ?  ?  ?  Passed - HCT in normal range and within 360 days  ?  Hematocrit  ?Date Value Ref Range Status  ?07/13/2021 40.3 37.5 - 51.0 % Final  ?  ?  ?  ?  Passed - PLT in normal range and within 360 days  ?  Platelets  ?Date Value Ref Range Status  ?07/13/2021 302 150 - 450 x10E3/uL Final  ?  ?  ?  ?  Passed - WBC in normal range and within 360 days  ?  WBC  ?Date Value Ref Range Status  ?07/13/2021 4.8 3.4 - 10.8 x10E3/uL Final  ?  ?  ?  ?  Passed - eGFR is 30 or above and within 360 days  ?  GFR calc Af Amer  ?Date Value Ref Range Status  ?12/24/2020 112 >59 mL/min/1.73 Final  ?  Comment:  ?  **In accordance with recommendations from the NKF-ASN Task force,** ?  [facility] is in the process of updating its eGFR calculation to the ?  2021 CKD-EPI creatinine equation that estimates kidney function ?  without a race variable. ?  ? ?GFR calc non Af Amer  ?Date Value Ref Range Status  ?12/24/2020 96 >59 mL/min/1.73 Final  ? ?eGFR  ?Date Value Ref Range Status  ?12/16/2021 101 >59 mL/min/1.73 Final  ?  ?  ?  ?  Passed - Valid encounter within last 12 months  ?  Recent Outpatient Visits   ? ?      ? 5 months ago Annual physical exam  ? [place], NP  ? 1 year ago Primary hypertension  ? [employer], DO  ? 1 year ago Immunization due  ? [facility] Kathrine Haddock, NP  ? 1 year ago Obstructive sleep apnea syndrome  ? [facility] Kathrine Haddock, NP  ? 1 year ago Essential hypertension  ? [place], [place], [place]  ? ?  ?   ?Future Appointments   ? ?        ? In 3 weeks Jonathon Bellows, MD [place]  ? ?  ? ?  ?  ?  ? ?

## 2022-01-07 ENCOUNTER — Encounter: Payer: Self-pay | Admitting: Nurse Practitioner

## 2022-01-07 ENCOUNTER — Other Ambulatory Visit: Payer: Self-pay | Admitting: Nurse Practitioner

## 2022-01-08 ENCOUNTER — Other Ambulatory Visit: Payer: Self-pay

## 2022-01-08 MED ORDER — FENOFIBRIC ACID 135 MG PO CPDR: 1.0000 | DELAYED_RELEASE_CAPSULE | Freq: Every day | ORAL | 1 refills | Status: DC

## 2022-01-11 ENCOUNTER — Ambulatory Visit: Payer: BC Managed Care – PPO | Admitting: Gastroenterology

## 2022-01-15 ENCOUNTER — Other Ambulatory Visit: Payer: Self-pay | Admitting: Nurse Practitioner

## 2022-01-15 DIAGNOSIS — T7840XA Allergy, unspecified, initial encounter: Secondary | ICD-10-CM

## 2022-01-24 ENCOUNTER — Other Ambulatory Visit: Payer: Self-pay | Admitting: Nurse Practitioner

## 2022-01-24 DIAGNOSIS — K219 Gastro-esophageal reflux disease without esophagitis: Secondary | ICD-10-CM

## 2022-01-26 ENCOUNTER — Encounter: Payer: Self-pay | Admitting: Gastroenterology

## 2022-01-26 ENCOUNTER — Other Ambulatory Visit: Payer: Self-pay

## 2022-01-26 ENCOUNTER — Ambulatory Visit (INDEPENDENT_AMBULATORY_CARE_PROVIDER_SITE_OTHER): Payer: BC Managed Care – PPO | Admitting: Gastroenterology

## 2022-01-26 VITALS — BP 138/85 | HR 78 | Temp 97.8°F | Ht 72.0 in | Wt 233.0 lb

## 2022-01-26 DIAGNOSIS — K219 Gastro-esophageal reflux disease without esophagitis: Secondary | ICD-10-CM | POA: Diagnosis not present

## 2022-01-26 NOTE — Progress Notes (Signed)
?  ?Wyline Mood MD, MRCP(U.K) ?[address]  ?[address]  ?[place], [place] [address]  ?Main: [phone]  ?Fax: [phone] ? ? ?Primary Care Physician: Donell Beers, FNP ? ?Primary Gastroenterologist:  Dr. Wyline Mood  ? ?Chief Complaint  ?Patient presents with  ? Abdominal Pain  ? ? ?HPI: David Barrera is a 56 y.o. male ? ? ?Summary of history : ? ?Initially referred and seen on 11/11/2021 for abdominal pain.  06/03/2020 underwent a colonoscopy that showed diverticulosis of the entire colon and internal hemorrhoids.  At his initial visit he had heartburn going on for a long time taking omeprazole with his food and he would walk up in the morning but with a burning sensation in his chest with epigastric pain change in bowel habits with softer stools f the consistency of pudding.  Gained weight.  Complains of postnasal drip and drainage.  Chronic cough. ? ?Interval history   11/11/2021 ? ?Referred to [facility] for postnasal drip and possibly causing chronic cough.  Advised to change his PPI to once a day before breakfast ? ?11/11/2021: H. pylori breath test negative, celiac serology negative. ? ?Since his last visit he states that the reflux symptoms are completely resolved after changing the timing of his PPI.  The Creon did not make much difference to his soft consistency of stools.  He states this has been ongoing for many years.  IIt even was going on at the time of his last colonoscopy. ? ?Current Outpatient Medications  ?Medication Sig Dispense Refill  ? allopurinol (ZYLOPRIM) 100 MG tablet Take 1 tablet (100 mg total) by mouth daily. 90 tablet 1  ? amLODipine (NORVASC) 2.5 MG tablet Take 1 tablet (2.5 mg total) by mouth daily. 90 tablet 3  ? amLODipine (NORVASC) 5 MG tablet Take 1 tablet (5 mg total) by mouth daily. 90 tablet 1  ? aspirin EC 81 MG tablet Take 81 mg by mouth daily.    ? cetirizine (ZYRTEC) 10 MG tablet TAKE 1 TABLET BY MOUTH EVERY DAY 30 tablet 1  ? Choline Fenofibrate (FENOFIBRIC  ACID) 135 MG CPDR Take 1 tablet by mouth daily. 90 capsule 1  ? fluticasone (FLONASE) 50 MCG/ACT nasal spray Place 2 sprays into both nostrils daily. 16 g 6  ? hydrochlorothiazide (HYDRODIURIL) 25 MG tablet TAKE 1 TABLET (25 MG TOTAL) BY MOUTH DAILY. 30 tablet 0  ? loperamide (IMODIUM A-D) 2 MG tablet Take 1 tablet (2 mg total) by mouth 4 (four) times daily as needed for diarrhea or loose stools. 30 tablet 0  ? metoprolol succinate (TOPROL-XL) 100 MG 24 hr tablet Take 1 tablet (100 mg total) by mouth daily. TAKE WITH OR IMMEDIATELY FOLLOWING A MEAL. 90 tablet 1  ? omeprazole (PRILOSEC) 20 MG capsule TAKE 1 CAPSULE BY MOUTH EVERY DAY 30 capsule 2  ? rosuvastatin (CRESTOR) 20 MG tablet TAKE 1 TABLET BY MOUTH EVERY DAY 30 tablet 0  ? sildenafil (REVATIO) 20 MG tablet Take 1-5 tablets prn 30 tablet 3  ? venlafaxine XR (EFFEXOR-XR) 37.5 MG 24 hr capsule TAKE 1 CAPSULE BY MOUTH DAILY WITH BREAKFAST. 90 capsule 2  ? ?No current facility-administered medications for this visit.  ? ? ?Allergies as of 01/26/2022 - Review Complete 01/26/2022  ?Allergen Reaction Noted  ? Benazepril hcl Cough 10/12/2016  ? Lipitor [atorvastatin] Other (See Comments) 08/05/2016  ? ? ?ROS: ? ?General: Negative for anorexia, weight loss, fever, chills, fatigue, weakness. ?ENT: Negative for hoarseness, difficulty swallowing , nasal congestion. ?  CV: Negative for chest pain, angina, palpitations, dyspnea on exertion, peripheral edema.  ?Respiratory: Negative for dyspnea at rest, dyspnea on exertion, cough, sputum, wheezing.  ?GI: See history of present illness. ?GU:  Negative for dysuria, hematuria, urinary incontinence, urinary frequency, nocturnal urination.  ?Endo: Negative for unusual weight change.  ?  ?Physical Examination: ? ? BP 138/85   Pulse 78   Temp 97.8 ?F (36.6 ?C) (Oral)   Ht 6' (1.829 m)   Wt 233 lb (105.7 kg)   BMI 31.60 kg/m?  ? ?General: Well-nourished, well-developed in no acute distress.  ?Eyes: No icterus. Conjunctivae  pink. ?Neuro: Alert and oriented x 3.  Grossly intact. ?Skin: Warm and dry, no jaundice.   ?Psych: Alert and cooperative, normal mood and affect. ? ? ?Imaging Studies: ?No results found. ? ?Assessment and Plan:  ? ?David Barrera is a 56 y.o. y/o male here to follow-up for GERD symptoms are well controlled on 40 mg of omeprazole once a day.  He has some symptoms suggestive of IBS diarrhea like symptoms which have not responded to Creon discussed briefly about a trial of Xifaxan but he would like to wait and see.  We can discontinue Creon since it does not help.  Occasionally can be caused due to omeprazole.  Since his symptoms of reflux are well controlled I suggested we decrease the dose of omeprazole to 20 mg once a day and at this follow-up in 6 months if he still is doing well from the reflux point of view we will change it to Pepcid 20 mg once a day.  I explained to him that I would like to use medication with a favorable side effect profile than omeprazole if possible ?  ? ? ? ?Dr Wyline Mood  MD,MRCP [facility]) ?Follow up in 6 months video visit ?

## 2022-01-27 DIAGNOSIS — G4733 Obstructive sleep apnea (adult) (pediatric): Secondary | ICD-10-CM | POA: Diagnosis not present

## 2022-02-02 ENCOUNTER — Other Ambulatory Visit: Payer: Self-pay | Admitting: *Deleted

## 2022-02-02 DIAGNOSIS — E785 Hyperlipidemia, unspecified: Secondary | ICD-10-CM

## 2022-02-02 MED ORDER — ROSUVASTATIN CALCIUM 20 MG PO TABS: 20.0000 mg | ORAL_TABLET | Freq: Every day | ORAL | 0 refills | Status: DC

## 2022-02-07 ENCOUNTER — Other Ambulatory Visit: Payer: Self-pay | Admitting: Nurse Practitioner

## 2022-02-07 DIAGNOSIS — T7840XA Allergy, unspecified, initial encounter: Secondary | ICD-10-CM

## 2022-02-15 ENCOUNTER — Other Ambulatory Visit: Payer: Self-pay | Admitting: Nurse Practitioner

## 2022-02-15 DIAGNOSIS — M1A00X0 Idiopathic chronic gout, unspecified site, without tophus (tophi): Secondary | ICD-10-CM

## 2022-02-15 NOTE — Telephone Encounter (Signed)
Requested medications are due for refill today.  yes ? ?Requested medications are on the active medications list.  yes ? ?Last refill. 07/24/2021 #90 1 refills ? ?Future visit scheduled.   no ? ?Notes to clinic.  Vena Rua is listed as PCP.  ? ? ? ?Requested Prescriptions  ?Pending Prescriptions Disp Refills  ? allopurinol (ZYLOPRIM) 100 MG tablet [Pharmacy Med Name: ALLOPURINOL 100 MG TABLET] 90 tablet 1  ?  Sig: TAKE 1 TABLET BY MOUTH EVERY DAY  ?  ? Endocrinology:  Gout Agents - allopurinol Passed - 02/15/2022  2:42 AM  ?  ?  Passed - Uric Acid in normal range and within 360 days  ?  Uric Acid  ?Date Value Ref Range Status  ?12/16/2021 4.1 3.8 - 8.4 mg/dL Final  ?  Comment:  ?             Therapeutic target for gout patients: <6.0  ?  ?  ?  ?  Passed - Cr in normal range and within 360 days  ?  Creatinine, Ser  ?Date Value Ref Range Status  ?12/16/2021 0.86 0.76 - 1.27 mg/dL Final  ?  ?  ?  ?  Passed - Valid encounter within last 12 months  ?  Recent Outpatient Visits   ? ?      ? 7 months ago Annual physical exam  ? [place], NP  ? 1 year ago Primary hypertension  ? [place], DO  ? 1 year ago Immunization due  ? [facility] Kathrine Haddock, NP  ? 1 year ago Obstructive sleep apnea syndrome  ? [facility] Kathrine Haddock, NP  ? 2 years ago Essential hypertension  ? [place], [place], [place]  ? ?  ?  ?Future Appointments   ? ?        ? In 5 months Jonathon Bellows, MD [place]  ? ?  ? ? ?  ?  ?  Passed - CBC within normal limits and completed in the last 12 months  ?  WBC  ?Date Value Ref Range Status  ?07/13/2021 4.8 3.4 - 10.8 x10E3/uL Final  ? ?RBC  ?Date Value Ref Range Status  ?07/13/2021 4.50 4.14 - 5.80 x10E6/uL Final  ? ?Hemoglobin  ?Date Value Ref Range Status  ?07/13/2021 13.9 13.0 - 17.7 g/dL Final  ? ?Hematocrit  ?Date Value Ref Range Status  ?07/13/2021 40.3 37.5 - 51.0  % Final  ? ?MCHC  ?Date Value Ref Range Status  ?07/13/2021 34.5 31.5 - 35.7 g/dL Final  ? ?MCH  ?Date Value Ref Range Status  ?07/13/2021 30.9 26.6 - 33.0 pg Final  ? ?MCV  ?Date Value Ref Range Status  ?07/13/2021 90 79 - 97 fL Final  ? ?No results found for: PLTCOUNTKUC, LABPLAT, [place] ?RDW  ?Date Value Ref Range Status  ?07/13/2021 12.5 11.6 - 15.4 % Final  ? ?  ?  ?  ?  ?

## 2022-03-01 ENCOUNTER — Encounter: Payer: Self-pay | Admitting: Family Medicine

## 2022-03-01 ENCOUNTER — Ambulatory Visit: Payer: BC Managed Care – PPO | Admitting: Family Medicine

## 2022-03-01 DIAGNOSIS — I10 Essential (primary) hypertension: Secondary | ICD-10-CM

## 2022-03-01 DIAGNOSIS — G4733 Obstructive sleep apnea (adult) (pediatric): Secondary | ICD-10-CM | POA: Diagnosis not present

## 2022-03-01 MED ORDER — HYDROCHLOROTHIAZIDE 25 MG PO TABS: 25.0000 mg | ORAL_TABLET | Freq: Every day | ORAL | 0 refills | Status: DC

## 2022-03-01 MED ORDER — HYDROCHLOROTHIAZIDE 25 MG PO TABS: 25.0000 mg | ORAL_TABLET | Freq: Every day | ORAL | 2 refills | Status: DC

## 2022-03-01 NOTE — Progress Notes (Signed)
? ?Established Patient Office Visit ? ?Subjective:  ?Patient ID: David Barrera, male    DOB: 09/21/1966  Age: 55 y.o. MRN: [id number] ? ?CC:  ?Chief Complaint  ?Patient presents with  ? Joint Swelling  ?  Pt complains of ankle swelling onset about a month ago 01/30/22  ? ? ?HPI ?David Barrera is a 55 y.o. male with a past medical history of hypertension, right knee pain, and anxiety presents with complaints of bilateral ankle swelling for about a month. He denies headaches, dizziness, and blurred vision. He reports not taking his blood pressure medication (Norvasc) this morning. The patient has also not taken his hydrochlorothiazide since February 2023.  ?  ? ?Past Medical History:  ?Diagnosis Date  ? Allergy   ? GERD (gastroesophageal reflux disease)   ? Hyperlipidemia   ? Hypertension   ? ? ?Past Surgical History:  ?Procedure Laterality Date  ? COLONOSCOPY WITH PROPOFOL    ? COLONOSCOPY WITH PROPOFOL N/A 06/03/2020  ? Procedure: COLONOSCOPY WITH PROPOFOL;  Surgeon: Wohl, Darren, MD;  Location: [facility] ENDOSCOPY;  Service: Endoscopy;  Laterality: N/A;  ? VASECTOMY    ? ? ?Family History  ?Problem Relation Age of Onset  ? Cancer Sister   ?     CA in the lymph node  ? Diabetes Sister   ? Diabetes Brother   ? ? ?Social History  ? ?Socioeconomic History  ? Marital status: Married  ?  Spouse name: Not on file  ? Number of children: 2  ? Years of education: Not on file  ? Highest education level: Some college, no degree  ?Occupational History  ? Not on file  ?Tobacco Use  ? Smoking status: Never  ? Smokeless tobacco: Never  ?Vaping Use  ? Vaping Use: Never used  ?Substance and Sexual Activity  ? Alcohol use: Yes  ?  Alcohol/week: 25.0 standard drinks  ?  Types: 25 Shots of liquor per week  ?  Comment: 2 or 3 drink of liquor a night  ? Drug use: No  ? Sexual activity: Yes  ?Other Topics Concern  ? Not on file  ?Social History Narrative  ? Not on file  ? ?Social Determinants of Health  ? ?Financial Resource Strain: Not on file   ?Food Insecurity: Not on file  ?Transportation Needs: Not on file  ?Physical Activity: Not on file  ?Stress: Not on file  ?Social Connections: Not on file  ?Intimate Partner Violence: Not on file  ? ? ?Outpatient Medications Prior to Visit  ?Medication Sig Dispense Refill  ? allopurinol (ZYLOPRIM) 100 MG tablet Take 1 tablet (100 mg total) by mouth daily. 90 tablet 1  ? amLODipine (NORVASC) 2.5 MG tablet Take 1 tablet (2.5 mg total) by mouth daily. 90 tablet 3  ? amLODipine (NORVASC) 5 MG tablet Take 1 tablet (5 mg total) by mouth daily. 90 tablet 1  ? aspirin EC 81 MG tablet Take 81 mg by mouth daily.    ? cetirizine (ZYRTEC) 10 MG tablet TAKE 1 TABLET BY MOUTH EVERY DAY 30 tablet 1  ? Choline Fenofibrate (FENOFIBRIC ACID) 135 MG CPDR Take 1 tablet by mouth daily. 90 capsule 1  ? fluticasone (FLONASE) 50 MCG/ACT nasal spray Place 2 sprays into both nostrils daily. 16 g 6  ? loperamide (IMODIUM A-D) 2 MG tablet Take 1 tablet (2 mg total) by mouth 4 (four) times daily as needed for diarrhea or loose stools. 30 tablet 0  ? metoprolol succinate (TOPROL-XL) 100 MG   24 hr tablet Take 1 tablet (100 mg total) by mouth daily. TAKE WITH OR IMMEDIATELY FOLLOWING A MEAL. 90 tablet 1  ? omeprazole (PRILOSEC) 20 MG capsule TAKE 1 CAPSULE BY MOUTH EVERY DAY 30 capsule 2  ? rosuvastatin (CRESTOR) 20 MG tablet Take 1 tablet (20 mg total) by mouth daily. 30 tablet 0  ? sildenafil (REVATIO) 20 MG tablet Take 1-5 tablets prn 30 tablet 3  ? venlafaxine XR (EFFEXOR-XR) 37.5 MG 24 hr capsule TAKE 1 CAPSULE BY MOUTH DAILY WITH BREAKFAST. 90 capsule 2  ? hydrochlorothiazide (HYDRODIURIL) 25 MG tablet TAKE 1 TABLET (25 MG TOTAL) BY MOUTH DAILY. 30 tablet 0  ? ?No facility-administered medications prior to visit.  ? ? ?Allergies  ?Allergen Reactions  ? Benazepril Hcl Cough  ? Lipitor [Atorvastatin] Other (See Comments)  ?  Myalgia leg pain  ? ? ?ROS ?Review of Systems  ?Constitutional:  Negative for chills, fatigue and fever.  ?HENT:   Negative for sinus pressure, sinus pain, sneezing and sore throat.   ?Eyes:  Negative for photophobia and redness.  ?Respiratory:  Positive for cough. Negative for shortness of breath and wheezing.   ?Gastrointestinal:  Negative for diarrhea, nausea and vomiting.  ?Endocrine: Negative for polydipsia, polyphagia and polyuria.  ?Genitourinary:  Negative for frequency, hematuria and urgency.  ?Musculoskeletal:  Negative for back pain, joint swelling and neck pain.  ?     Ankles swelling  ?Skin:  Negative for rash and wound.  ?Neurological:  Negative for weakness, numbness and headaches.  ?Psychiatric/Behavioral:  Negative for confusion, self-injury and suicidal ideas.   ? ?  ?Objective:  ?  ?Physical Exam ?Constitutional:   ?   Appearance: Normal appearance.  ?HENT:  ?   Head: Normocephalic.  ?   Nose: No congestion or rhinorrhea.  ?Cardiovascular:  ?   Rate and Rhythm: Normal rate and regular rhythm.  ?   Pulses: Normal pulses.  ?   Heart sounds: Normal heart sounds.  ?Pulmonary:  ?   Effort: Pulmonary effort is normal.  ?   Breath sounds: Normal breath sounds.  ?Musculoskeletal:  ?   Cervical back: No rigidity.  ?   Right lower leg: Swelling (trace) present.  ?   Left lower leg: Swelling (trace) present.  ?Skin: ?   Coloration: Skin is not jaundiced or pale.  ?Psychiatric:  ?   Comments: Normal affect  ? ? ?BP (!) 144/90 (BP Location: Left Arm, Patient Position: Sitting)   Pulse 86   Ht 6' (1.829 m)   Wt 236 lb 12.8 oz (107.4 kg)   SpO2 98%   BMI 32.12 kg/m?  ?Wt Readings from Last 3 Encounters:  ?03/01/22 236 lb 12.8 oz (107.4 kg)  ?01/26/22 233 lb (105.7 kg)  ?11/17/21 225 lb (102.1 kg)  ? ? ?Lab Results  ?Component Value Date  ? TSH 1.700 07/13/2021  ? ?Lab Results  ?Component Value Date  ? WBC 4.8 07/13/2021  ? HGB 13.9 07/13/2021  ? HCT 40.3 07/13/2021  ? MCV 90 07/13/2021  ? PLT 302 07/13/2021  ? ?Lab Results  ?Component Value Date  ? NA 142 12/16/2021  ? K 3.7 12/16/2021  ? CO2 19 (L) 12/16/2021  ?  GLUCOSE 155 (H) 12/16/2021  ? BUN 18 12/16/2021  ? CREATININE 0.86 12/16/2021  ? BILITOT 0.3 12/16/2021  ? ALKPHOS 41 (L) 12/16/2021  ? AST 35 12/16/2021  ? ALT 22 12/16/2021  ? PROT 7.1 12/16/2021  ? ALBUMIN 4.6 12/16/2021  ? CALCIUM 9.6  12/16/2021  ? EGFR 101 12/16/2021  ? ?Lab Results  ?Component Value Date  ? CHOL 189 12/16/2021  ? ?Lab Results  ?Component Value Date  ? HDL 46 12/16/2021  ? ?Lab Results  ?Component Value Date  ? [place] 83 12/16/2021  ? ?Lab Results  ?Component Value Date  ? TRIG 373 (H) 12/16/2021  ? ?Lab Results  ?Component Value Date  ? CHOLHDL 4.1 12/16/2021  ? ?Lab Results  ?Component Value Date  ? HGBA1C 5.5 03/23/2017  ? ? ?  ?Assessment & Plan:  ? ?Problem List Items Addressed This Visit   ? ?  ? Cardiovascular and Mediastinum  ? Hypertension  ?  -Elevated BP 144/90 ?-Reports not taking blood pressure medication this morning ?-Refilled Hydrochlorothiazide with two refills until he sees his PCP in July 2023. ? ?  ?  ? Relevant Medications  ? hydrochlorothiazide (HYDRODIURIL) 25 MG tablet  ? ? ?Meds ordered this encounter  ?Medications  ? DISCONTD: hydrochlorothiazide (HYDRODIURIL) 25 MG tablet  ?  Sig: Take 1 tablet (25 mg total) by mouth daily.  ?  Dispense:  30 tablet  ?  Refill:  0  ? hydrochlorothiazide (HYDRODIURIL) 25 MG tablet  ?  Sig: Take 1 tablet (25 mg total) by mouth daily.  ?  Dispense:  30 tablet  ?  Refill:  2  ? ? ?Follow-up: No follow-ups on file.  ? ? ?Alvira Monday, FNP ?

## 2022-03-01 NOTE — Assessment & Plan Note (Signed)
-  Elevated BP 144/90 ?-Reports not taking blood pressure medication this morning ?-Refilled Hydrochlorothiazide with two refills until he sees his PCP in July 2023. ?

## 2022-03-01 NOTE — Telephone Encounter (Signed)
Appt scheduled

## 2022-03-01 NOTE — Patient Instructions (Signed)
I appreciate the opportunity to provide care to you today! ? ? ?Refilled:  Hydrochlorothiazide 25 mg daily ? ? ?  ?It was a pleasure to see you and I look forward to continuing to work together on your health and well-being. ?Please do not hesitate to call the office if you need care or have questions about your care. ?  ?Have a wonderful day and week. ?With Gratitude, ?Gilmore Laroche MSN, FNP-BC ? ?

## 2022-03-05 ENCOUNTER — Other Ambulatory Visit: Payer: Self-pay | Admitting: Nurse Practitioner

## 2022-03-05 DIAGNOSIS — E785 Hyperlipidemia, unspecified: Secondary | ICD-10-CM

## 2022-03-05 DIAGNOSIS — T7840XA Allergy, unspecified, initial encounter: Secondary | ICD-10-CM

## 2022-03-19 ENCOUNTER — Other Ambulatory Visit: Payer: Self-pay | Admitting: Nurse Practitioner

## 2022-03-19 ENCOUNTER — Other Ambulatory Visit: Payer: Self-pay

## 2022-03-19 DIAGNOSIS — F339 Major depressive disorder, recurrent, unspecified: Secondary | ICD-10-CM

## 2022-03-19 DIAGNOSIS — K219 Gastro-esophageal reflux disease without esophagitis: Secondary | ICD-10-CM

## 2022-03-19 DIAGNOSIS — I10 Essential (primary) hypertension: Secondary | ICD-10-CM

## 2022-03-19 MED ORDER — VENLAFAXINE HCL ER 37.5 MG PO CP24: 37.5000 mg | ORAL_CAPSULE | Freq: Every day | ORAL | 2 refills | Status: DC

## 2022-03-19 MED ORDER — OMEPRAZOLE 20 MG PO CPDR: 20.0000 mg | DELAYED_RELEASE_CAPSULE | Freq: Every day | ORAL | 2 refills | Status: DC

## 2022-03-19 MED ORDER — AMLODIPINE BESYLATE 2.5 MG PO TABS: 2.5000 mg | ORAL_TABLET | Freq: Every day | ORAL | 3 refills | Status: DC

## 2022-03-19 MED ORDER — METOPROLOL SUCCINATE ER 100 MG PO TB24: 100.0000 mg | ORAL_TABLET | Freq: Every day | ORAL | 1 refills | Status: DC

## 2022-03-19 NOTE — Telephone Encounter (Signed)
Rx sent 

## 2022-04-02 ENCOUNTER — Other Ambulatory Visit: Payer: Self-pay | Admitting: Nurse Practitioner

## 2022-04-02 DIAGNOSIS — M1A00X0 Idiopathic chronic gout, unspecified site, without tophus (tophi): Secondary | ICD-10-CM

## 2022-04-05 ENCOUNTER — Other Ambulatory Visit: Payer: Self-pay | Admitting: Nurse Practitioner

## 2022-04-05 ENCOUNTER — Encounter: Payer: Self-pay | Admitting: Nurse Practitioner

## 2022-04-05 DIAGNOSIS — M1A00X0 Idiopathic chronic gout, unspecified site, without tophus (tophi): Secondary | ICD-10-CM

## 2022-04-05 MED ORDER — ALLOPURINOL 100 MG PO TABS: 100.0000 mg | ORAL_TABLET | Freq: Every day | ORAL | 1 refills | Status: DC

## 2022-04-06 ENCOUNTER — Other Ambulatory Visit: Payer: Self-pay | Admitting: Nurse Practitioner

## 2022-04-06 ENCOUNTER — Encounter: Payer: Self-pay | Admitting: Family Medicine

## 2022-04-06 ENCOUNTER — Ambulatory Visit: Payer: BC Managed Care – PPO | Admitting: Family Medicine

## 2022-04-06 VITALS — BP 130/82 | HR 87 | Ht 72.0 in | Wt 236.0 lb

## 2022-04-06 DIAGNOSIS — E785 Hyperlipidemia, unspecified: Secondary | ICD-10-CM

## 2022-04-06 DIAGNOSIS — T7840XA Allergy, unspecified, initial encounter: Secondary | ICD-10-CM

## 2022-04-06 DIAGNOSIS — R0602 Shortness of breath: Secondary | ICD-10-CM | POA: Diagnosis not present

## 2022-04-06 MED ORDER — ALBUTEROL SULFATE HFA 108 (90 BASE) MCG/ACT IN AERS: 2.0000 | INHALATION_SPRAY | Freq: Four times a day (QID) | RESPIRATORY_TRACT | 0 refills | Status: DC | PRN

## 2022-04-06 NOTE — Telephone Encounter (Signed)
Pt made appt 04-08-22 this was the first appt he could come in to be evaluated

## 2022-04-06 NOTE — Patient Instructions (Addendum)
I appreciate the opportunity to provide care to you today!    Please pick up your inhaler at the pharmacy   Our goal: exercise for 26mins at least three times a week.    Please continue to a heart-healthy diet and increase your physical activities. Try to exercise for 43mins at least three times a week.   - extra fat around your neck, chest and abdomen can make it difficult to breath.     It was a pleasure to see you and I look forward to continuing to work together on your health and well-being. Please do not hesitate to call the office if you need care or have questions about your care.   Have a wonderful day and week. With Gratitude, Alvira Monday MSN, FNP-BC

## 2022-04-06 NOTE — Progress Notes (Signed)
Established Patient Office Visit  Subjective:  Patient ID: David Barrera, male    DOB: 11/27/1965  Age: 56 y.o. MRN: [id number]  CC:  Chief Complaint  Patient presents with   Breathing Problem    Pt complains of sob, onset 03/06/2022, happens when he is doing activity like walking.     HPI David Barrera is a 56 y.o. male with past medical history of hypertension and acid reflux presents with complaints of SOB with exertion for 1 month, reporting SOB when ambulating short distances. He denies cardiac and stroke-like symptoms as indicated in the ROS, with no hx of anemia. The patient is obese, with a BMI of 32.01. He admits to minimum physical activities.   Past Medical History:  Diagnosis Date   Allergy    GERD (gastroesophageal reflux disease)    Hyperlipidemia    Hypertension     Past Surgical History:  Procedure Laterality Date   COLONOSCOPY WITH PROPOFOL     COLONOSCOPY WITH PROPOFOL N/A 06/03/2020   Procedure: COLONOSCOPY WITH PROPOFOL;  Surgeon: Midge Minium, MD;  Location: [facility] ENDOSCOPY;  Service: Endoscopy;  Laterality: N/A;   VASECTOMY      Family History  Problem Relation Age of Onset   Cancer Sister        CA in the lymph node   Diabetes Sister    Diabetes Brother     Social History   Socioeconomic History   Marital status: Married    Spouse name: Not on file   Number of children: 2   Years of education: Not on file   Highest education level: Some college, no degree  Occupational History   Not on file  Tobacco Use   Smoking status: Never   Smokeless tobacco: Never  Vaping Use   Vaping Use: Never used  Substance and Sexual Activity   Alcohol use: Yes    Alcohol/week: 25.0 standard drinks    Types: 25 Shots of liquor per week    Comment: 2 or 3 drink of liquor a night   Drug use: No   Sexual activity: Yes  Other Topics Concern   Not on file  Social History Narrative   Not on file   Social Determinants of Health   Financial Resource Strain:  Not on file  Food Insecurity: Not on file  Transportation Needs: Not on file  Physical Activity: Not on file  Stress: Not on file  Social Connections: Not on file  Intimate Partner Violence: Not on file    Outpatient Medications Prior to Visit  Medication Sig Dispense Refill   allopurinol (ZYLOPRIM) 100 MG tablet Take 1 tablet (100 mg total) by mouth daily. 90 tablet 1   amLODipine (NORVASC) 2.5 MG tablet Take 1 tablet (2.5 mg total) by mouth daily. 90 tablet 3   amLODipine (NORVASC) 5 MG tablet Take 1 tablet (5 mg total) by mouth daily. 90 tablet 1   aspirin EC 81 MG tablet Take 81 mg by mouth daily.     cetirizine (ZYRTEC) 10 MG tablet TAKE 1 TABLET BY MOUTH EVERY DAY 30 tablet 1   Choline Fenofibrate (FENOFIBRIC ACID) 135 MG CPDR Take 1 tablet by mouth daily. 90 capsule 1   fluticasone (FLONASE) 50 MCG/ACT nasal spray Place 2 sprays into both nostrils daily. 16 g 6   hydrochlorothiazide (HYDRODIURIL) 25 MG tablet Take 1 tablet (25 mg total) by mouth daily. 30 tablet 2   loperamide (IMODIUM A-D) 2 MG tablet Take 1 tablet (2  mg total) by mouth 4 (four) times daily as needed for diarrhea or loose stools. 30 tablet 0   metoprolol succinate (TOPROL-XL) 100 MG 24 hr tablet Take 1 tablet (100 mg total) by mouth daily. TAKE WITH OR IMMEDIATELY FOLLOWING A MEAL. 90 tablet 1   omeprazole (PRILOSEC) 20 MG capsule Take 1 capsule (20 mg total) by mouth daily. 90 capsule 2   rosuvastatin (CRESTOR) 20 MG tablet TAKE 1 TABLET BY MOUTH EVERY DAY 30 tablet 2   sildenafil (REVATIO) 20 MG tablet Take 1-5 tablets prn 30 tablet 3   venlafaxine XR (EFFEXOR-XR) 37.5 MG 24 hr capsule Take 1 capsule (37.5 mg total) by mouth daily with breakfast. 90 capsule 2   No facility-administered medications prior to visit.    Allergies  Allergen Reactions   Benazepril Hcl Cough   Lipitor [Atorvastatin] Other (See Comments)    Myalgia leg pain    ROS Review of Systems  Constitutional:  Negative for chills,  fatigue and fever.  Respiratory:  Positive for cough (chronic cough 5-8 years) and shortness of breath (with exertion). Negative for choking, chest tightness, wheezing and stridor.   Cardiovascular:  Negative for chest pain, palpitations and leg swelling.  Neurological:  Negative for dizziness, tremors, syncope, facial asymmetry, weakness, light-headedness, numbness and headaches.  Psychiatric/Behavioral:  Negative for confusion.      Objective:    Physical Exam Constitutional:      Appearance: Normal appearance.  HENT:     Head: Normocephalic.  Cardiovascular:     Rate and Rhythm: Normal rate and regular rhythm.     Pulses: Normal pulses.     Heart sounds: Normal heart sounds. No murmur heard.   No friction rub. No gallop.  Pulmonary:     Effort: Pulmonary effort is normal. No respiratory distress.     Breath sounds: Normal breath sounds. No stridor. No wheezing, rhonchi or rales.  Chest:     Chest wall: No tenderness.  Skin:    General: Skin is warm.  Neurological:     Mental Status: He is alert.    BP 130/82 (BP Location: Left Arm)   Pulse 87   Ht 6' (1.829 m)   Wt 236 lb (107 kg)   SpO2 96%   BMI 32.01 kg/m  Wt Readings from Last 3 Encounters:  04/06/22 236 lb (107 kg)  03/01/22 236 lb 12.8 oz (107.4 kg)  01/26/22 233 lb (105.7 kg)    Lab Results  Component Value Date   TSH 1.700 07/13/2021   Lab Results  Component Value Date   WBC 4.8 07/13/2021   HGB 13.9 07/13/2021   HCT 40.3 07/13/2021   MCV 90 07/13/2021   PLT 302 07/13/2021   Lab Results  Component Value Date   NA 142 12/16/2021   K 3.7 12/16/2021   CO2 19 (L) 12/16/2021   GLUCOSE 155 (H) 12/16/2021   BUN 18 12/16/2021   CREATININE 0.86 12/16/2021   BILITOT 0.3 12/16/2021   ALKPHOS 41 (L) 12/16/2021   AST 35 12/16/2021   ALT 22 12/16/2021   PROT 7.1 12/16/2021   ALBUMIN 4.6 12/16/2021   CALCIUM 9.6 12/16/2021   EGFR 101 12/16/2021   Lab Results  Component Value Date   CHOL 189  12/16/2021   Lab Results  Component Value Date   HDL 46 12/16/2021   Lab Results  Component Value Date   LDLCALC 83 12/16/2021   Lab Results  Component Value Date   TRIG 373 (H) 12/16/2021  Lab Results  Component Value Date   CHOLHDL 4.1 12/16/2021   Lab Results  Component Value Date   HGBA1C 5.5 03/23/2017      Assessment & Plan:   Problem List Items Addressed This Visit       Other   SOB (shortness of breath) on exertion - Primary    Symptoms for 1 month Pt has noticed SOB on exertion with activities No cardiac, stroke-like symptoms or hx of anemia reported BMI of 32.01 Recommended increasing physical activities and  weight loss to maintain a healthy BMI Pt states that he will comply with the above recommendation by eating healthier and exercising for 11min at least X3 a week Albuterol ordered      Relevant Medications   albuterol (VENTOLIN HFA) 108 (90 Base) MCG/ACT inhaler    Meds ordered this encounter  Medications   albuterol (VENTOLIN HFA) 108 (90 Base) MCG/ACT inhaler    Sig: Inhale 2 puffs into the lungs every 6 (six) hours as needed for wheezing or shortness of breath.    Dispense:  8 g    Refill:  0    Follow-up: No follow-ups on file.    Alvira Monday, FNP

## 2022-04-06 NOTE — Assessment & Plan Note (Signed)
Symptoms for 1 month Pt has noticed SOB on exertion with activities No cardiac, stroke-like symptoms or hx of anemia reported BMI of 32.01 Recommended increasing physical activities and  weight loss to maintain a healthy BMI Pt states that he will comply with the above recommendation by eating healthier and exercising for at least X3 a week Albuterol ordered

## 2022-04-08 ENCOUNTER — Ambulatory Visit: Payer: BC Managed Care – PPO | Admitting: Family Medicine

## 2022-04-13 ENCOUNTER — Other Ambulatory Visit: Payer: Self-pay | Admitting: Nurse Practitioner

## 2022-04-13 DIAGNOSIS — I10 Essential (primary) hypertension: Secondary | ICD-10-CM

## 2022-04-24 ENCOUNTER — Other Ambulatory Visit: Payer: Self-pay | Admitting: Family Medicine

## 2022-04-24 DIAGNOSIS — I10 Essential (primary) hypertension: Secondary | ICD-10-CM

## 2022-04-26 ENCOUNTER — Other Ambulatory Visit: Payer: Self-pay | Admitting: Nurse Practitioner

## 2022-04-26 DIAGNOSIS — I10 Essential (primary) hypertension: Secondary | ICD-10-CM

## 2022-04-28 ENCOUNTER — Other Ambulatory Visit: Payer: Self-pay | Admitting: Nurse Practitioner

## 2022-04-28 DIAGNOSIS — T7840XA Allergy, unspecified, initial encounter: Secondary | ICD-10-CM

## 2022-04-28 DIAGNOSIS — H524 Presbyopia: Secondary | ICD-10-CM | POA: Diagnosis not present

## 2022-05-02 ENCOUNTER — Encounter: Payer: Self-pay | Admitting: Nurse Practitioner

## 2022-05-03 NOTE — Telephone Encounter (Signed)
Please advise 

## 2022-05-03 NOTE — Telephone Encounter (Signed)
Called pt no answer left vm 

## 2022-05-05 ENCOUNTER — Encounter: Payer: Self-pay | Admitting: Nurse Practitioner

## 2022-05-05 ENCOUNTER — Ambulatory Visit: Payer: BC Managed Care – PPO | Admitting: Nurse Practitioner

## 2022-05-05 VITALS — BP 133/85 | HR 74 | Ht 72.0 in | Wt 237.0 lb

## 2022-05-05 DIAGNOSIS — R600 Localized edema: Secondary | ICD-10-CM

## 2022-05-05 DIAGNOSIS — E785 Hyperlipidemia, unspecified: Secondary | ICD-10-CM | POA: Diagnosis not present

## 2022-05-05 DIAGNOSIS — F339 Major depressive disorder, recurrent, unspecified: Secondary | ICD-10-CM | POA: Diagnosis not present

## 2022-05-05 DIAGNOSIS — I10 Essential (primary) hypertension: Secondary | ICD-10-CM | POA: Diagnosis not present

## 2022-05-05 NOTE — Telephone Encounter (Signed)
Spoke with pt he will come in today at 97 ok'd per fola

## 2022-05-05 NOTE — Assessment & Plan Note (Signed)
PHQ-9 score 0 Condition well-controlled on Effexor 37.5 mg daily Continue current medication

## 2022-05-05 NOTE — Assessment & Plan Note (Addendum)
Chronic condition worse since the past 2 weeks. Review of patient's chart showed that patient had similar complaint of bilateral leg edema about a year ago. He has stopped taking amlodipine since 3 days ago, reports that swelling is better since he stopped taking amlodipine Blood pressure normal in the office today Stop amlodipine continue metoprolol 100 mg daily hydrochlorothiazide 25 mg daily Keep legs elevated when sitting Wear compression socks to help edema CMP, BMP before next visit.

## 2022-05-05 NOTE — Assessment & Plan Note (Addendum)
BP Readings from Last 3 Encounters:  05/05/22 133/85  04/06/22 130/82  03/01/22 (!) 144/90  Currently on hydrochlorothiazide 25 mg daily, metoprolol 100 mg daily.  He has stopped taking amlodipine 3 days ago due to concerns with edema Since blood pressure is normal in the office today patient told to not take amlodipine, amlodipine discontinued DASH diet advised engage in regular moderate exercises at least 150 minutes weekly.  Continue hydrochlorothiazide 25 mg daily, metoprolol 100 mg daily Monitor blood pressure at home goal is BP less than 140/90 CMP, BMP before next visit follow up in 2 weeks , will add losartan if BP is elevated at next visit.

## 2022-05-05 NOTE — Patient Instructions (Addendum)
Please stop taking amlodipine, measure your blood pressure daily , keep a log and bring to your next appointment in 2 weeks. Avoid salty foods and exercises regularly. Blood pressure goal is less than 140/90. Please keep legs elevated when sitting , wear compression socks     It is important that you exercise regularly at least 30 minutes 5 times a week.  Think about what you will eat, plan ahead. Choose " clean, green, fresh or frozen" over canned, processed or packaged foods which are more sugary, salty and fatty. 70 to 75% of food eaten should be vegetables and fruit. Three meals at set times with snacks allowed between meals, but they must be fruit or vegetables. Aim to eat over a 12 hour period , example 7 am to 7 pm, and STOP after  your last meal of the day. Drink water,generally about 64 ounces per day, no other drink is as healthy. Fruit juice is best enjoyed in a healthy way, by EATING the fruit.  Thanks for choosing [facility], we consider it a privelige to serve you.

## 2022-05-05 NOTE — Progress Notes (Signed)
   David Barrera     MRN: [id number]      DOB: Aug 25, 1966   HPI Mr. Liaw with past medical history of hypertension, hyperlipidemia, depression, obesity is here for complaints of bilateral lower extremity edema.  Patient complains of  bilateral ankle and feet  swelling since the past one month, swelling has been worse since the past two weeks , left worse that the right, He has been on amlodipine 7.5mg  for months . He stopped taking amlodipine since 3 days ago. He states that the swelling in both legs have decreased since stopping amlodipine . Swelling is worse as the day goes on . denies pain, numbness, tingling, SOB.          ROS Denies recent fever or chills. Denies sinus pressure, nasal congestion, ear pain or sore throat. Denies chest congestion, productive cough or wheezing. Denies chest pains, palpitations and leg swelling Denies abdominal pain, nausea, vomiting,diarrhea or constipation.   Denies dysuria, frequency, hesitancy or incontinence. Denies joint pain, swelling and limitation in mobility. Denies headaches, seizures, numbness, or tingling. Denies depression, anxiety or insomnia.    PE  BP 133/85   Pulse 74   Ht 6' (1.829 m)   Wt 237 lb (107.5 kg)   SpO2 92%   BMI 32.14 kg/m   Patient alert and oriented and in no cardiopulmonary distress.  Chest: Clear to auscultation bilaterally.  CVS: S1, S2 no murmurs, no S3.Regular rate.  ABD: Soft non tender.   Ext: Nonpitting edema of bilateral lower extremities noted, worse on the left leg, edema extending from ankles to legs bilaterally   MS: Adequate ROM spine, shoulders, hips and knees.  Skin: Intact, no ulcerations or rash noted.  Psych: Good eye contact, normal affect. Memory intact not anxious or depressed appearing.  CNS: CN 2-12 intact, power,  normal throughout.no focal deficits noted.   Assessment & Plan  Hypertension BP Readings from Last 3 Encounters:  05/05/22 133/85  04/06/22 130/82   03/01/22 (!) 144/90  Currently on hydrochlorothiazide 25 mg daily, metoprolol 100 mg daily.  He has stopped taking amlodipine 3 days ago due to concerns with edema Since blood pressure is normal in the office today patient told to not take amlodipine, amlodipine discontinued DASH diet advised engage in regular moderate exercises at least 150 minutes weekly.  Continue hydrochlorothiazide 25 mg daily, metoprolol 100 mg daily Monitor blood pressure at home goal is BP less than 140/90 CMP, BMP before next visit follow up in 2 weeks , will add losartan if BP is elevated at next visit.   Bilateral lower extremity edema Chronic condition worse since the past 2 weeks. Review of patient's chart showed that patient had similar complaint of bilateral leg edema about a year ago. He has stopped taking amlodipine since 3 days ago, reports that swelling is better since he stopped taking amlodipine Blood pressure normal in the office today Stop amlodipine continue metoprolol 100 mg daily hydrochlorothiazide 25 mg daily Keep legs elevated when sitting Wear compression socks to help edema CMP, BMP before next visit.   Hyperlipidemia Currently on rosuvastatin 20 mg daily Check lipid panel before next visit  Depression, recurrent (HCC) PHQ-9 score 0 Condition well-controlled on Effexor 37.5 mg daily Continue current medication

## 2022-05-05 NOTE — Assessment & Plan Note (Signed)
Currently on rosuvastatin 20 mg daily Check lipid panel before next visit

## 2022-05-06 ENCOUNTER — Other Ambulatory Visit: Payer: Self-pay | Admitting: Family Medicine

## 2022-05-06 DIAGNOSIS — R0602 Shortness of breath: Secondary | ICD-10-CM

## 2022-05-11 ENCOUNTER — Other Ambulatory Visit: Payer: Self-pay | Admitting: Nurse Practitioner

## 2022-05-11 DIAGNOSIS — F339 Major depressive disorder, recurrent, unspecified: Secondary | ICD-10-CM

## 2022-05-13 ENCOUNTER — Other Ambulatory Visit: Payer: Self-pay | Admitting: Nurse Practitioner

## 2022-05-13 MED ORDER — LOSARTAN POTASSIUM 25 MG PO TABS: 25.0000 mg | ORAL_TABLET | Freq: Every day | ORAL | 0 refills | Status: DC

## 2022-05-13 NOTE — Telephone Encounter (Signed)
Please advise 

## 2022-05-18 ENCOUNTER — Other Ambulatory Visit: Payer: Self-pay | Admitting: Nurse Practitioner

## 2022-05-18 DIAGNOSIS — T7840XD Allergy, unspecified, subsequent encounter: Secondary | ICD-10-CM

## 2022-05-18 MED ORDER — SILDENAFIL CITRATE 20 MG PO TABS: ORAL_TABLET | ORAL | 3 refills | Status: DC

## 2022-05-18 NOTE — Telephone Encounter (Signed)
Please advise rx filled 07/2021 by another provider

## 2022-05-19 ENCOUNTER — Ambulatory Visit (INDEPENDENT_AMBULATORY_CARE_PROVIDER_SITE_OTHER): Payer: BC Managed Care – PPO | Admitting: Nurse Practitioner

## 2022-05-19 ENCOUNTER — Encounter: Payer: Self-pay | Admitting: Nurse Practitioner

## 2022-05-19 VITALS — BP 146/86 | HR 82 | Ht 72.0 in | Wt 237.0 lb

## 2022-05-19 DIAGNOSIS — I10 Essential (primary) hypertension: Secondary | ICD-10-CM | POA: Diagnosis not present

## 2022-05-19 DIAGNOSIS — R600 Localized edema: Secondary | ICD-10-CM | POA: Diagnosis not present

## 2022-05-19 DIAGNOSIS — Z23 Encounter for immunization: Secondary | ICD-10-CM | POA: Diagnosis not present

## 2022-05-19 DIAGNOSIS — E785 Hyperlipidemia, unspecified: Secondary | ICD-10-CM | POA: Diagnosis not present

## 2022-05-19 DIAGNOSIS — E669 Obesity, unspecified: Secondary | ICD-10-CM | POA: Diagnosis not present

## 2022-05-19 NOTE — Patient Instructions (Signed)
Please start taking losartan 25mg  daily as dicussed  Second dose of shingles vaccine today     It is important that you exercise regularly at least 30 minutes 5 times a week.  Think about what you will eat, plan ahead. Choose " clean, green, fresh or frozen" over canned, processed or packaged foods which are more sugary, salty and fatty. 70 to 75% of food eaten should be vegetables and fruit. Three meals at set times with snacks allowed between meals, but they must be fruit or vegetables. Aim to eat over a 12 hour period , example 7 am to 7 pm, and STOP after  your last meal of the day. Drink water,generally about 64 ounces per day, no other drink is as healthy. Fruit juice is best enjoyed in a healthy way, by EATING the fruit.  Thanks for choosing [facility], we consider it a privelige to serve you.

## 2022-05-19 NOTE — Progress Notes (Signed)
   David Barrera     MRN: [id number]      DOB: 1966-04-18   HPI David Barrera with past medical history of hypertension, hyperlipidemia, obesity is here for follow up for hypertension.  Patient stated that he is bilateral feet edema has greatly improved since he stopped taking amlodipine. He has not started taking losartan yet , plans to start taking med today , has not taken any of his pills today.  Patient reports that his systolic blood pressure at home was in the 140s.  He did not bring his blood pressure log today.  He denies headache, dizziness, chest pain       ROS Denies recent fever or chills. Denies sinus pressure, nasal congestion, ear pain or sore throat. Denies chest congestion, productive cough or wheezing. Denies chest pains, palpitations and leg swelling Denies abdominal pain, nausea, vomiting,diarrhea or constipation.   Denies dysuria, frequency, hesitancy or incontinence. Denies joint pain, swelling and limitation in mobility. Denies headaches, seizures, numbness, or tingling. Denies depression, anxiety or insomnia.    PE  BP (!) 146/86   Pulse 82   Ht 6' (1.829 m)   Wt 237 lb 0.6 oz (107.5 kg)   SpO2 94%   BMI 32.15 kg/m   Patient alert and oriented and in no cardiopulmonary distress.   Chest: Clear to auscultation bilaterally.  CVS: S1, S2 no murmurs, no S3.Regular rate.  ABD: Soft non tender.   Ext: No edema  MS: Adequate ROM spine, shoulders, hips and knees.  Psych: Good eye contact, normal affect. Memory intact not anxious or depressed appearing.  CNS: CN 2-12 intact, power,  normal throughout.no focal deficits noted.   Assessment & Plan  Obesity (BMI 30-39.9) Wt Readings from Last 3 Encounters:  05/19/22 237 lb 0.6 oz (107.5 kg)  05/05/22 237 lb (107.5 kg)  04/06/22 236 lb (107 kg)  Patient stated that he does not exercise regularly Pt  counseled on low-carb diet encouraged to engage in regular moderate exercises at least 150 minutes  weekly as tolerated  Hypertension BP Readings from Last 3 Encounters:  05/19/22 (!) 146/86  05/05/22 133/85  04/06/22 130/82  Currently on hydrochlorothiazide 25 mg daily, metoprolol 100 mg daily.  He plans starting losartan 25 mg today DASH diet advised engage in regular walking exercises at least 150 minutes weekly Monitor blood pressure daily at home keep a log and bring to next visit CMP today BMP in 2 weeks Follow-up in 4 weeks  Hyperlipidemia Lipid panel ordered today On rosuvastatin 20 mg daily  Need for shingles vaccine Patient educated on CDC recommendation for the shingles vaccine. Verbal consent was obtained from the patient, vaccine administered by nurse, no sign of adverse reactions noted at this time. Patient education on arm soreness and use of tylenol  for this patient  was discussed. Patient educated on the signs and symptoms of adverse effect and advise to contact the office if they occur.   Bilateral lower extremity edema No edema noted on today's examination

## 2022-05-19 NOTE — Assessment & Plan Note (Signed)
Lipid panel ordered today On rosuvastatin 20 mg daily

## 2022-05-19 NOTE — Assessment & Plan Note (Signed)
BP Readings from Last 3 Encounters:  05/19/22 (!) 146/86  05/05/22 133/85  04/06/22 130/82  Currently on hydrochlorothiazide 25 mg daily, metoprolol 100 mg daily.  He plans starting losartan 25 mg today DASH diet advised engage in regular walking exercises at least 150 minutes weekly Monitor blood pressure daily at home keep a log and bring to next visit CMP today BMP in 2 weeks Follow-up in 4 weeks

## 2022-05-19 NOTE — Assessment & Plan Note (Addendum)
Wt Readings from Last 3 Encounters:  05/19/22 237 lb 0.6 oz (107.5 kg)  05/05/22 237 lb (107.5 kg)  04/06/22 236 lb (107 kg)  Patient stated that he does not exercise regularly Pt  counseled on low-carb diet encouraged to engage in regular moderate exercises at least 150 minutes weekly as tolerated

## 2022-05-19 NOTE — Assessment & Plan Note (Signed)
No edema noted on today's examination

## 2022-05-19 NOTE — Assessment & Plan Note (Signed)
Patient educated on CDC recommendation for theshingles vaccine. Verbal consent was obtained from the patient, vaccine administered by nurse, no sign of adverse reactions noted at this time. Patient education on arm soreness and use of tylenol  for this patient  was discussed. Patient educated on the signs and symptoms of adverse effect and advise to contact the office if they occur.  

## 2022-05-20 ENCOUNTER — Other Ambulatory Visit: Payer: Self-pay | Admitting: Nurse Practitioner

## 2022-05-20 LAB — CMP14+EGFR
ALT: 22 IU/L (ref 0–44)
AST: 23 IU/L (ref 0–40)
Albumin/Globulin Ratio: 2.3 — ABNORMAL HIGH (ref 1.2–2.2)
Albumin: 4.8 g/dL (ref 3.8–4.9)
Alkaline Phosphatase: 37 IU/L — ABNORMAL LOW (ref 44–121)
BUN/Creatinine Ratio: 13 (ref 9–20)
BUN: 13 mg/dL (ref 6–24)
Bilirubin Total: 0.2 mg/dL (ref 0.0–1.2)
CO2: 25 mmol/L (ref 20–29)
Calcium: 10 mg/dL (ref 8.7–10.2)
Chloride: 98 mmol/L (ref 96–106)
Creatinine, Ser: 0.97 mg/dL (ref 0.76–1.27)
Globulin, Total: 2.1 g/dL (ref 1.5–4.5)
Glucose: 123 mg/dL — ABNORMAL HIGH (ref 70–99)
Potassium: 3.8 mmol/L (ref 3.5–5.2)
Sodium: 139 mmol/L (ref 134–144)
Total Protein: 6.9 g/dL (ref 6.0–8.5)
eGFR: 90 mL/min/{1.73_m2} (ref >59)

## 2022-05-20 LAB — LIPID PANEL
Chol/HDL Ratio: 5.3 ratio — ABNORMAL HIGH (ref 0.0–5.0)
Cholesterol, Total: 262 mg/dL — ABNORMAL HIGH (ref 100–199)
HDL: 49 mg/dL (ref >39)
LDL Chol Calc (NIH): 116 mg/dL — ABNORMAL HIGH (ref 0–99)
Triglycerides: 549 mg/dL — ABNORMAL HIGH (ref 0–149)
VLDL Cholesterol Cal: 97 mg/dL — ABNORMAL HIGH (ref 5–40)

## 2022-05-20 MED ORDER — ROSUVASTATIN CALCIUM 40 MG PO TABS: 40.0000 mg | ORAL_TABLET | Freq: Every day | ORAL | 3 refills | Status: DC

## 2022-05-20 NOTE — Progress Notes (Signed)
LDL is not at goal of less than 100, triglyceride high, patient should start taking Crestor 40 mg daily, continue current dose of fenofibrate avoid fatty fried foods.     The 10-year ASCVD risk score (Arnett DK, et al., 2019) is: 14%   Values used to calculate the score:     Age: 56 years     Sex: Male     Is Non-Hispanic African American: No     Diabetic: No     Tobacco smoker: No     Systolic Blood Pressure: 146 mmHg     Is BP treated: Yes     HDL Cholesterol: 49 mg/dL     Total Cholesterol: 262 mg/dL

## 2022-05-24 NOTE — Telephone Encounter (Signed)
Rx filled

## 2022-05-25 ENCOUNTER — Other Ambulatory Visit: Payer: Self-pay | Admitting: Nurse Practitioner

## 2022-05-25 DIAGNOSIS — T7840XA Allergy, unspecified, initial encounter: Secondary | ICD-10-CM

## 2022-06-14 DIAGNOSIS — I10 Essential (primary) hypertension: Secondary | ICD-10-CM | POA: Diagnosis not present

## 2022-06-15 LAB — BASIC METABOLIC PANEL
BUN/Creatinine Ratio: 23 — ABNORMAL HIGH (ref 9–20)
BUN: 21 mg/dL (ref 6–24)
CO2: 23 mmol/L (ref 20–29)
Calcium: 10.3 mg/dL — ABNORMAL HIGH (ref 8.7–10.2)
Chloride: 98 mmol/L (ref 96–106)
Creatinine, Ser: 0.91 mg/dL (ref 0.76–1.27)
Glucose: 101 mg/dL — ABNORMAL HIGH (ref 70–99)
Potassium: 3.7 mmol/L (ref 3.5–5.2)
Sodium: 138 mmol/L (ref 134–144)
eGFR: 98 mL/min/{1.73_m2} (ref >59)

## 2022-06-15 NOTE — Progress Notes (Signed)
Will review results at his upcoming appointment

## 2022-06-16 ENCOUNTER — Encounter: Payer: Self-pay | Admitting: Nurse Practitioner

## 2022-06-16 ENCOUNTER — Ambulatory Visit: Payer: BC Managed Care – PPO | Admitting: Nurse Practitioner

## 2022-06-16 VITALS — BP 134/83 | HR 90 | Ht 72.0 in | Wt 236.0 lb

## 2022-06-16 DIAGNOSIS — I10 Essential (primary) hypertension: Secondary | ICD-10-CM

## 2022-06-16 DIAGNOSIS — E785 Hyperlipidemia, unspecified: Secondary | ICD-10-CM

## 2022-06-16 DIAGNOSIS — E669 Obesity, unspecified: Secondary | ICD-10-CM

## 2022-06-16 MED ORDER — LOSARTAN POTASSIUM 25 MG PO TABS
25.0000 mg | ORAL_TABLET | Freq: Every day | ORAL | 1 refills | Status: DC
Start: 2022-06-16 — End: 2022-07-21

## 2022-06-16 NOTE — Progress Notes (Addendum)
   David Barrera     MRN: [id number]      DOB: 1966-10-30   HPI David Barrera with past medical history of hypertension, hyperlipidemia, obesity, obstructive sleep apnea is here for follow up for hypertension  Hypertension.  Currently on losartan 25 mg daily, hydrochlorothiazide 25 mg daily, metoprolol 100 mg daily.  Patient denies dizziness, chest pain, edema.   Hyperlipidemia.  Currently on rosuvastatin 40 mg daily denies muscle aches    ROS Denies recent fever or chills. Denies sinus pressure, nasal congestion, ear pain or sore throat. Denies chest congestion, productive cough or wheezing. Denies chest pains, palpitations and leg swelling Denies abdominal pain, nausea, vomiting,diarrhea or constipation.   Denies joint pain, swelling and limitation in mobility. Denies headaches, seizures, numbness, or tingling. Denies depression, anxiety or insomnia.    PE  BP 134/83 (BP Location: Left Arm, Patient Position: Sitting, Cuff Size: Large)   Pulse 90   Ht 6' (1.829 m)   Wt 236 lb (107 kg)   SpO2 92%   BMI 32.01 kg/m   Patient alert and oriented and in no cardiopulmonary distress.   Chest: Clear to auscultation bilaterally.  CVS: S1, S2 no murmurs, no S3.Regular rate.  ABD: Soft non tender.   Ext: No edema  MS: Adequate ROM spine, shoulders, hips and knees.  Psych: Good eye contact, normal affect. Memory intact not anxious or depressed appearing.  CNS: CN 2-12 intact, power,  normal throughout.no focal deficits noted.   Assessment & Plan  Hypertension BP Readings from Last 3 Encounters:  06/16/22 134/83  05/19/22 (!) 146/86  05/05/22 133/85  Chronic condition well-controlled Continue losartan 25 mg daily, hydrochlorothiazide 25 mg daily, metoprolol 100 mg daily DASH diet advised Engage in regular moderate exercises at least 150 minutes weekly Follow up in 6 months Lab Results  Component Value Date   NA 138 06/14/2022   K 3.7 06/14/2022   CO2 23 06/14/2022    GLUCOSE 101 (H) 06/14/2022   BUN 21 06/14/2022   CREATININE 0.91 06/14/2022   CALCIUM 10.3 (H) 06/14/2022   EGFR 98 06/14/2022   GFRNONAA 96 12/24/2020    Obesity (BMI 30-39.9) Wt Readings from Last 3 Encounters:  06/16/22 236 lb (107 kg)  05/19/22 237 lb 0.6 oz (107.5 kg)  05/05/22 237 lb (107.5 kg)  Patient counseled on low-carb diet encouraged to engage in regular moderate exercises at least 150 minutes weekly.   Hyperlipidemia Currently on rosuvastatin 40 mg daily Check LDL in 1 month Avoid fatty fried foods  Hypercalcemia Lab Results  Component Value Date   CALCIUM 10.3 (H) 06/14/2022  denies bone pain  avoid calcium supplement  Will recheck labs at next visit

## 2022-06-16 NOTE — Assessment & Plan Note (Signed)
Lab Results  Component Value Date   CALCIUM 10.3 (H) 06/14/2022  denies bone pain  avoid calcium supplement  Will recheck labs at next visit

## 2022-06-16 NOTE — Assessment & Plan Note (Addendum)
BP Readings from Last 3 Encounters:  06/16/22 134/83  05/19/22 (!) 146/86  05/05/22 133/85  Chronic condition well-controlled Continue losartan 25 mg daily, hydrochlorothiazide 25 mg daily, metoprolol 100 mg daily DASH diet advised Engage in regular moderate exercises at least 150 minutes weekly Follow up in 6 months Lab Results  Component Value Date   NA 138 06/14/2022   K 3.7 06/14/2022   CO2 23 06/14/2022   GLUCOSE 101 (H) 06/14/2022   BUN 21 06/14/2022   CREATININE 0.91 06/14/2022   CALCIUM 10.3 (H) 06/14/2022   EGFR 98 06/14/2022   GFRNONAA 96 12/24/2020

## 2022-06-16 NOTE — Patient Instructions (Addendum)
Please get your fasting labs done in one month, we will call you with the results   It is important that you exercise regularly at least 30 minutes 5 times a week.  Think about what you will eat, plan ahead. Choose " clean, green, fresh or frozen" over canned, processed or packaged foods which are more sugary, salty and fatty. 70 to 75% of food eaten should be vegetables and fruit. Three meals at set times with snacks allowed between meals, but they must be fruit or vegetables. Aim to eat over a 12 hour period , example 7 am to 7 pm, and STOP after  your last meal of the day. Drink water,generally about 64 ounces per day, no other drink is as healthy. Fruit juice is best enjoyed in a healthy way, by EATING the fruit.  Thanks for choosing [facility], we consider it a privelige to serve you.

## 2022-06-16 NOTE — Assessment & Plan Note (Signed)
Currently on rosuvastatin 40 mg daily Check LDL in 1 month Avoid fatty fried foods

## 2022-06-16 NOTE — Assessment & Plan Note (Addendum)
Wt Readings from Last 3 Encounters:  06/16/22 236 lb (107 kg)  05/19/22 237 lb 0.6 oz (107.5 kg)  05/05/22 237 lb (107.5 kg)  Patient counseled on low-carb diet encouraged to engage in regular moderate exercises at least 150 minutes weekly.

## 2022-06-22 ENCOUNTER — Other Ambulatory Visit: Payer: Self-pay | Admitting: Nurse Practitioner

## 2022-06-22 DIAGNOSIS — T7840XA Allergy, unspecified, initial encounter: Secondary | ICD-10-CM

## 2022-06-26 ENCOUNTER — Other Ambulatory Visit: Payer: Self-pay | Admitting: Nurse Practitioner

## 2022-06-26 DIAGNOSIS — E785 Hyperlipidemia, unspecified: Secondary | ICD-10-CM

## 2022-07-01 ENCOUNTER — Other Ambulatory Visit: Payer: Self-pay | Admitting: Nurse Practitioner

## 2022-07-01 DIAGNOSIS — M1A00X0 Idiopathic chronic gout, unspecified site, without tophus (tophi): Secondary | ICD-10-CM

## 2022-07-15 ENCOUNTER — Other Ambulatory Visit: Payer: Self-pay | Admitting: Nurse Practitioner

## 2022-07-15 DIAGNOSIS — T7840XA Allergy, unspecified, initial encounter: Secondary | ICD-10-CM

## 2022-07-21 ENCOUNTER — Other Ambulatory Visit: Payer: Self-pay

## 2022-07-21 ENCOUNTER — Encounter: Payer: Self-pay | Admitting: Family Medicine

## 2022-07-21 MED ORDER — LOSARTAN POTASSIUM 25 MG PO TABS: 25.0000 mg | ORAL_TABLET | Freq: Every day | ORAL | 1 refills | Status: DC

## 2022-07-29 ENCOUNTER — Ambulatory Visit: Payer: BC Managed Care – PPO | Admitting: Gastroenterology

## 2022-09-13 ENCOUNTER — Other Ambulatory Visit: Payer: Self-pay | Admitting: Nurse Practitioner

## 2022-09-13 DIAGNOSIS — T7840XA Allergy, unspecified, initial encounter: Secondary | ICD-10-CM

## 2022-10-07 ENCOUNTER — Other Ambulatory Visit: Payer: Self-pay | Admitting: Family Medicine

## 2022-10-07 ENCOUNTER — Other Ambulatory Visit: Payer: Self-pay | Admitting: Nurse Practitioner

## 2022-10-07 DIAGNOSIS — I10 Essential (primary) hypertension: Secondary | ICD-10-CM

## 2022-10-07 DIAGNOSIS — K219 Gastro-esophageal reflux disease without esophagitis: Secondary | ICD-10-CM

## 2022-10-07 DIAGNOSIS — T7840XA Allergy, unspecified, initial encounter: Secondary | ICD-10-CM

## 2022-12-17 ENCOUNTER — Encounter: Payer: BC Managed Care – PPO | Admitting: Nurse Practitioner

## 2022-12-17 ENCOUNTER — Encounter: Payer: BC Managed Care – PPO | Admitting: Family Medicine

## 2022-12-25 ENCOUNTER — Other Ambulatory Visit: Payer: Self-pay | Admitting: Nurse Practitioner

## 2022-12-25 DIAGNOSIS — I10 Essential (primary) hypertension: Secondary | ICD-10-CM

## 2022-12-30 ENCOUNTER — Encounter: Payer: Self-pay | Admitting: Radiology

## 2023-01-05 ENCOUNTER — Other Ambulatory Visit: Payer: Self-pay | Admitting: Nurse Practitioner

## 2023-01-28 ENCOUNTER — Other Ambulatory Visit: Payer: Self-pay

## 2023-01-28 DIAGNOSIS — M1A00X0 Idiopathic chronic gout, unspecified site, without tophus (tophi): Secondary | ICD-10-CM

## 2023-01-28 MED ORDER — ALLOPURINOL 100 MG PO TABS: 100.0000 mg | ORAL_TABLET | Freq: Every day | ORAL | 4 refills | Status: DC

## 2023-03-13 ENCOUNTER — Other Ambulatory Visit: Payer: Self-pay | Admitting: Nurse Practitioner

## 2023-03-13 DIAGNOSIS — E785 Hyperlipidemia, unspecified: Secondary | ICD-10-CM

## 2023-03-25 ENCOUNTER — Encounter: Payer: Self-pay | Admitting: Family Medicine

## 2023-04-17 ENCOUNTER — Other Ambulatory Visit: Payer: Self-pay | Admitting: Family Medicine

## 2023-04-17 DIAGNOSIS — T7840XA Allergy, unspecified, initial encounter: Secondary | ICD-10-CM

## 2023-04-17 DIAGNOSIS — I10 Essential (primary) hypertension: Secondary | ICD-10-CM

## 2023-04-18 ENCOUNTER — Other Ambulatory Visit: Payer: Self-pay | Admitting: Family Medicine

## 2023-04-18 DIAGNOSIS — K219 Gastro-esophageal reflux disease without esophagitis: Secondary | ICD-10-CM

## 2023-05-11 ENCOUNTER — Ambulatory Visit: Payer: 59 | Admitting: Family Medicine

## 2023-05-11 ENCOUNTER — Other Ambulatory Visit: Payer: Self-pay | Admitting: Family Medicine

## 2023-05-11 ENCOUNTER — Encounter: Payer: Self-pay | Admitting: Family Medicine

## 2023-05-11 VITALS — BP 148/88 | HR 102 | Ht 72.0 in | Wt 234.1 lb

## 2023-05-11 DIAGNOSIS — N528 Other male erectile dysfunction: Secondary | ICD-10-CM

## 2023-05-11 DIAGNOSIS — R202 Paresthesia of skin: Secondary | ICD-10-CM | POA: Insufficient documentation

## 2023-05-11 DIAGNOSIS — M25512 Pain in left shoulder: Secondary | ICD-10-CM | POA: Diagnosis not present

## 2023-05-11 MED ORDER — SILDENAFIL CITRATE 20 MG PO TABS
ORAL_TABLET | ORAL | 3 refills | Status: DC
Start: 2023-05-11 — End: 2023-05-12

## 2023-05-11 NOTE — Progress Notes (Signed)
Established Patient Office Visit  Subjective:  Patient ID: David Barrera, male    DOB: [birth year]  Age: 57 y.o. MRN: [id number]  CC:  Chief Complaint  Patient presents with   Shoulder Pain    Reports left shoulder discomfort and slight pain .     HPI David Barrera is a 57 y.o. male  presents with c/o intermittent tingling sensation behind the left shoulder blade. Tingling sensation is non radiating. No stroke like symptoms reported. No cardiac symptoms reports. No recent injury or trauma reported.  No fever or chills reported.  No neck pain numbness or weakness in the left shoulder and arm reported.    Past Medical History:  Diagnosis Date   Allergy    GERD (gastroesophageal reflux disease)    Hyperlipidemia    Hypertension     Past Surgical History:  Procedure Laterality Date   COLONOSCOPY WITH PROPOFOL     COLONOSCOPY WITH PROPOFOL N/A 06/03/2020   Procedure: COLONOSCOPY WITH PROPOFOL;  Surgeon: Midge Minium, MD;  Location: [facility] ENDOSCOPY;  Service: Endoscopy;  Laterality: N/A;   VASECTOMY      Family History  Problem Relation Age of Onset   Cancer Sister        CA in the lymph node   Diabetes Sister    Diabetes Brother     Social History   Socioeconomic History   Marital status: Married    Spouse name: Not on file   Number of children: 2   Years of education: Not on file   Highest education level: Some college, no degree  Occupational History   Not on file  Tobacco Use   Smoking status: Never   Smokeless tobacco: Never  Vaping Use   Vaping Use: Never used  Substance and Sexual Activity   Alcohol use: Yes    Alcohol/week: 25.0 standard drinks of alcohol    Types: 25 Shots of liquor per week    Comment: 2 or 3 drink of liquor a night   Drug use: No   Sexual activity: Yes  Other Topics Concern   Not on file  Social History Narrative   Not on file   Social Determinants of Health   Financial Resource Strain: Not on file  Food Insecurity: Not on  file  Transportation Needs: Not on file  Physical Activity: Not on file  Stress: Not on file  Social Connections: Not on file  Intimate Partner Violence: Not on file    Outpatient Medications Prior to Visit  Medication Sig Dispense Refill   albuterol (VENTOLIN HFA) 108 (90 Base) MCG/ACT inhaler TAKE 2 PUFFS BY MOUTH EVERY 6 HOURS AS NEEDED FOR WHEEZE OR SHORTNESS OF BREATH 18 each 2   allopurinol (ZYLOPRIM) 100 MG tablet Take 1 tablet (100 mg total) by mouth daily. 41 tablet 4   aspirin EC 81 MG tablet Take 81 mg by mouth daily.     cetirizine (ZYRTEC) 10 MG tablet TAKE 1 TABLET BY MOUTH EVERY DAY 90 tablet 1   Choline Fenofibrate (FENOFIBRIC ACID) 135 MG CPDR TAKE 1 CAPSULE BY MOUTH EVERY DAY 90 capsule 1   fluticasone (FLONASE) 50 MCG/ACT nasal spray SPRAY 2 SPRAYS INTO EACH NOSTRIL EVERY DAY 16 mL 6   hydrochlorothiazide (HYDRODIURIL) 25 MG tablet TAKE 1 TABLET (25 MG TOTAL) BY MOUTH DAILY. 90 tablet 1   losartan (COZAAR) 25 MG tablet TAKE 1 TABLET (25 MG TOTAL) BY MOUTH DAILY. 90 tablet 1   metoprolol succinate (TOPROL-XL) 100 MG  24 hr tablet TAKE 1 TABLET BY MOUTH EVERY DAY WITH OR IMMEDIATELY FOLLOWING A MEAL 90 tablet 1   omeprazole (PRILOSEC) 20 MG capsule TAKE 1 CAPSULE BY MOUTH EVERY DAY 90 capsule 2   rosuvastatin (CRESTOR) 40 MG tablet Take 1 tablet (40 mg total) by mouth daily. 90 tablet 3   venlafaxine XR (EFFEXOR-XR) 37.5 MG 24 hr capsule Take 1 capsule (37.5 mg total) by mouth daily with breakfast. DX: F33.9 90 capsule 3   sildenafil (REVATIO) 20 MG tablet Take 1-5 tablets prn 30 tablet 3   No facility-administered medications prior to visit.    Allergies  Allergen Reactions   Benazepril Hcl Cough   Lipitor [Atorvastatin] Other (See Comments)    Myalgia leg pain    ROS Review of Systems  Constitutional:  Negative for fatigue and fever.  Eyes:  Negative for visual disturbance.  Respiratory:  Negative for chest tightness and shortness of breath.   Cardiovascular:   Negative for chest pain and palpitations.  Neurological:  Negative for dizziness and headaches.      Objective:    Physical Exam HENT:     Head: Normocephalic.     Right Ear: External ear normal.     Left Ear: External ear normal.     Nose: No congestion or rhinorrhea.     Mouth/Throat:     Mouth: Mucous membranes are moist.  Cardiovascular:     Rate and Rhythm: Regular rhythm.     Heart sounds: No murmur heard. Pulmonary:     Effort: No respiratory distress.     Breath sounds: Normal breath sounds.  Musculoskeletal:        General: No swelling, tenderness, deformity or signs of injury.  Neurological:     Mental Status: He is alert and oriented to person, place, and time.     GCS: GCS eye subscore is 4. GCS verbal subscore is 5. GCS motor subscore is 6.     Motor: No weakness or tremor.     Coordination: Romberg sign negative. Coordination normal.     Gait: Gait normal.     BP (!) 148/88 (BP Location: Left Arm)   Pulse (!) 102   Ht 6' (1.829 m)   Wt 234 lb 1.3 oz (106.2 kg)   SpO2 94%   BMI 31.75 kg/m  Wt Readings from Last 3 Encounters:  05/11/23 234 lb 1.3 oz (106.2 kg)  06/16/22 236 lb (107 kg)  05/19/22 237 lb 0.6 oz (107.5 kg)    Lab Results  Component Value Date   TSH 1.700 07/13/2021   Lab Results  Component Value Date   WBC 4.8 07/13/2021   HGB 13.9 07/13/2021   HCT 40.3 07/13/2021   MCV 90 07/13/2021   PLT 302 07/13/2021   Lab Results  Component Value Date   NA 138 06/14/2022   K 3.7 06/14/2022   CO2 23 06/14/2022   GLUCOSE 101 (H) 06/14/2022   BUN 21 06/14/2022   CREATININE 0.91 06/14/2022   BILITOT 0.2 05/19/2022   ALKPHOS 37 (L) 05/19/2022   AST 23 05/19/2022   ALT 22 05/19/2022   PROT 6.9 05/19/2022   ALBUMIN 4.8 05/19/2022   CALCIUM 10.3 (H) 06/14/2022   EGFR 98 06/14/2022   Lab Results  Component Value Date   CHOL 262 (H) 05/19/2022   Lab Results  Component Value Date   HDL 49 05/19/2022   Lab Results  Component Value  Date   LDLCALC 116 (H) 05/19/2022  Lab Results  Component Value Date   TRIG 549 (H) 05/19/2022   Lab Results  Component Value Date   CHOLHDL 5.3 (H) 05/19/2022   Lab Results  Component Value Date   HGBA1C 5.5 03/23/2017      Assessment & Plan:  Paresthesia Assessment & Plan: Declines imaging studies currently Will perform home exercises attached to his AVS Pending cbc, bmp, B12 and Folate Encouraged supportive care with rest, heat application, over-the-counter analgesic and avoidance of aggravating activities    Orders: -     CBC -     B12 and Folate Panel -     BMP8+EGFR  Other male erectile dysfunction Assessment & Plan: Refill sent to the pharmacy  Orders: -     Sildenafil Citrate; Take 1-5 tablets prn  Dispense: 30 tablet; Refill: 3  Note: This chart has been completed using Engineer, civil (consulting) software, and while attempts have been made to ensure accuracy, certain words and phrases may not be transcribed as intended.    Follow-up: Return in about 2 weeks (around 05/25/2023).   Gilmore Laroche, FNP

## 2023-05-11 NOTE — Assessment & Plan Note (Signed)
Declines imaging studies currently Will perform home exercises attached to his AVS Pending cbc, bmp, B12 and Folate Encouraged supportive care with rest, heat application, over-the-counter analgesic and avoidance of aggravating activities

## 2023-05-11 NOTE — Assessment & Plan Note (Signed)
Refill sent to the pharmacy 

## 2023-05-11 NOTE — Patient Instructions (Addendum)
I appreciate the opportunity to provide care to you today!    Follow up:  2 weeks  Labs: please stop by the lab today to get your blood drawn (CBC, BMP, B12 and Folate)  I recommend  supportive treatment with rest, application of heat and cold therapy and otc NSAID ( ibuprofen/ tylenol ) to help reduce pain and inflammation  Attached with your AVS, you will find valuable resources for self-education. I highly recommend dedicating some time to thoroughly examine them.  Please continue to a heart-healthy diet and increase your physical activities. Try to exercise for at least five days a week.    It was a pleasure to see you and I look forward to continuing to work together on your health and well-being. Please do not hesitate to call the office if you need care or have questions about your care.  In case of emergency, please visit the Emergency Department for urgent care, or contact our clinic at [phone] to schedule an appointment. We're here to help you!   Have a wonderful day and week. With Gratitude, Gilmore Laroche MSN, FNP-BC

## 2023-05-12 ENCOUNTER — Other Ambulatory Visit: Payer: Self-pay | Admitting: Family Medicine

## 2023-05-12 DIAGNOSIS — N528 Other male erectile dysfunction: Secondary | ICD-10-CM

## 2023-05-12 LAB — CBC
Hematocrit: 39.7 % (ref 37.5–51.0)
Hemoglobin: 13.6 g/dL (ref 13.0–17.7)
MCH: 30.9 pg (ref 26.6–33.0)
MCHC: 34.3 g/dL (ref 31.5–35.7)
MCV: 90 fL (ref 79–97)
Platelets: 266 10*3/uL (ref 150–450)
RBC: 4.4 x10E6/uL (ref 4.14–5.80)
RDW: 13.1 % (ref 11.6–15.4)
WBC: 4.1 10*3/uL (ref 3.4–10.8)

## 2023-05-12 LAB — BMP8+EGFR
BUN/Creatinine Ratio: 17 (ref 9–20)
BUN: 16 mg/dL (ref 6–24)
CO2: 24 mmol/L (ref 20–29)
Calcium: 10.9 mg/dL — ABNORMAL HIGH (ref 8.7–10.2)
Chloride: 96 mmol/L (ref 96–106)
Creatinine, Ser: 0.93 mg/dL (ref 0.76–1.27)
Glucose: 107 mg/dL — ABNORMAL HIGH (ref 70–99)
Potassium: 3.9 mmol/L (ref 3.5–5.2)
Sodium: 139 mmol/L (ref 134–144)
eGFR: 95 mL/min/{1.73_m2} (ref >59)

## 2023-05-12 LAB — B12 AND FOLATE PANEL
Folate: 8.4 ng/mL (ref >3.0)
Vitamin B-12: 329 pg/mL (ref 232–1245)

## 2023-05-12 MED ORDER — SILDENAFIL CITRATE 20 MG PO TABS: 20.0000 mg | ORAL_TABLET | ORAL | 3 refills | Status: DC | PRN

## 2023-05-12 NOTE — Progress Notes (Signed)
Please inform the patient that his labs are stable with a slightly elevated calcium but not too concerning.

## 2023-05-15 ENCOUNTER — Other Ambulatory Visit: Payer: Self-pay | Admitting: Nurse Practitioner

## 2023-05-15 DIAGNOSIS — F339 Major depressive disorder, recurrent, unspecified: Secondary | ICD-10-CM

## 2023-06-01 ENCOUNTER — Ambulatory Visit: Payer: 59 | Admitting: Family Medicine

## 2023-06-21 ENCOUNTER — Other Ambulatory Visit: Payer: Self-pay | Admitting: Family Medicine

## 2023-06-21 ENCOUNTER — Other Ambulatory Visit: Payer: Self-pay | Admitting: Nurse Practitioner

## 2023-07-19 ENCOUNTER — Other Ambulatory Visit: Payer: Self-pay | Admitting: Family Medicine

## 2023-07-19 DIAGNOSIS — I10 Essential (primary) hypertension: Secondary | ICD-10-CM

## 2023-08-11 ENCOUNTER — Other Ambulatory Visit: Payer: Self-pay | Admitting: Family Medicine

## 2023-08-11 DIAGNOSIS — M1A00X0 Idiopathic chronic gout, unspecified site, without tophus (tophi): Secondary | ICD-10-CM

## 2023-10-10 ENCOUNTER — Other Ambulatory Visit: Payer: Self-pay | Admitting: Family Medicine

## 2023-10-10 DIAGNOSIS — T7840XA Allergy, unspecified, initial encounter: Secondary | ICD-10-CM

## 2023-10-11 ENCOUNTER — Ambulatory Visit: Payer: Self-pay | Admitting: Family Medicine

## 2023-10-11 ENCOUNTER — Encounter: Payer: Self-pay | Admitting: Family Medicine

## 2023-10-11 VITALS — BP 165/96 | HR 103 | Ht 72.0 in | Wt 244.1 lb

## 2023-10-11 DIAGNOSIS — B349 Viral infection, unspecified: Secondary | ICD-10-CM

## 2023-10-11 MED ORDER — PROMETHAZINE-DM 6.25-15 MG/5ML PO SYRP
5.0000 mL | ORAL_SOLUTION | Freq: Four times a day (QID) | ORAL | 0 refills | Status: AC | PRN
Start: 2023-10-11 — End: ?

## 2023-10-11 MED ORDER — FLUTICASONE PROPIONATE 50 MCG/ACT NA SUSP: 2.0000 | Freq: Every day | NASAL | 1 refills | Status: AC

## 2023-10-11 NOTE — Progress Notes (Signed)
Acute Office Visit  Subjective:    Patient ID: David Barrera, male    DOB: 10-26-1966, 57 y.o.   MRN: [id number]  Chief Complaint  Patient presents with   URI    Pt reports a head cold , cough and congestion only has used mucinex otc. Ongoing for about 3 weeks now.    HPI The patient is here today with complaints of nasal congestion and a cough with clear phlegm for about 3 weeks. There are no reports of fever, chills, facial pain or pressure, sore throat, nausea, vomiting, diarrhea, or recent sick contacts. The patient has been taking over-the-counter Mucinex with minimal relief of symptoms. The patient denies generalized malaise, fatigue, or fever   Past Medical History:  Diagnosis Date   Allergy    GERD (gastroesophageal reflux disease)    Hyperlipidemia    Hypertension     Past Surgical History:  Procedure Laterality Date   COLONOSCOPY WITH PROPOFOL     COLONOSCOPY WITH PROPOFOL N/A 06/03/2020   Procedure: COLONOSCOPY WITH PROPOFOL;  Surgeon: Midge Minium, MD;  Location: [facility] ENDOSCOPY;  Service: Endoscopy;  Laterality: N/A;   VASECTOMY      Family History  Problem Relation Age of Onset   Cancer Sister        CA in the lymph node   Diabetes Sister    Diabetes Brother     Social History   Socioeconomic History   Marital status: Married    Spouse name: Not on file   Number of children: 2   Years of education: Not on file   Highest education level: Some college, no degree  Occupational History   Not on file  Tobacco Use   Smoking status: Never   Smokeless tobacco: Never  Vaping Use   Vaping status: Never Used  Substance and Sexual Activity   Alcohol use: Yes    Alcohol/week: 25.0 standard drinks of alcohol    Types: 25 Shots of liquor per week    Comment: 2 or 3 drink of liquor a night   Drug use: No   Sexual activity: Yes  Other Topics Concern   Not on file  Social History Narrative   Not on file   Social Determinants of Health   Financial  Resource Strain: Not on file  Food Insecurity: Not on file  Transportation Needs: Not on file  Physical Activity: Not on file  Stress: Not on file  Social Connections: Not on file  Intimate Partner Violence: Not on file    Outpatient Medications Prior to Visit  Medication Sig Dispense Refill   albuterol (VENTOLIN HFA) 108 (90 Base) MCG/ACT inhaler TAKE 2 PUFFS BY MOUTH EVERY 6 HOURS AS NEEDED FOR WHEEZE OR SHORTNESS OF BREATH 18 each 2   allopurinol (ZYLOPRIM) 100 MG tablet TAKE 1 TABLET BY MOUTH EVERY DAY 90 tablet 3   aspirin EC 81 MG tablet Take 81 mg by mouth daily.     cetirizine (ZYRTEC) 10 MG tablet TAKE 1 TABLET BY MOUTH EVERY DAY 30 tablet 5   Choline Fenofibrate (FENOFIBRIC ACID) 135 MG CPDR TAKE 1 CAPSULE BY MOUTH EVERY DAY 30 capsule 5   fluticasone (FLONASE) 50 MCG/ACT nasal spray SPRAY 2 SPRAYS INTO EACH NOSTRIL EVERY DAY 16 mL 6   hydrochlorothiazide (HYDRODIURIL) 25 MG tablet TAKE 1 TABLET (25 MG TOTAL) BY MOUTH DAILY. 90 tablet 1   losartan (COZAAR) 25 MG tablet TAKE 1 TABLET (25 MG TOTAL) BY MOUTH DAILY. 30 tablet 5  metoprolol succinate (TOPROL-XL) 100 MG 24 hr tablet TAKE 1 TABLET BY MOUTH EVERY DAY WITH OR IMMEDIATELY FOLLOWING A MEAL 30 tablet 5   omeprazole (PRILOSEC) 20 MG capsule TAKE 1 CAPSULE BY MOUTH EVERY DAY 90 capsule 2   rosuvastatin (CRESTOR) 40 MG tablet TAKE 1 TABLET BY MOUTH EVERY DAY 90 tablet 3   sildenafil (REVATIO) 20 MG tablet Take 1 tablet (20 mg total) by mouth as needed. Take 0.5-4h before sexual activity; Do not take>1 dose per day 30 tablet 3   venlafaxine XR (EFFEXOR-XR) 37.5 MG 24 hr capsule TAKE 1 CAPSULE (37.5 MG TOTAL) BY MOUTH DAILY WITH BREAKFAST. DX: F33.9 90 capsule 3   No facility-administered medications prior to visit.    Allergies  Allergen Reactions   Benazepril Hcl Cough   Lipitor [Atorvastatin] Other (See Comments)    Myalgia leg pain    Review of Systems  Constitutional:  Negative for fatigue and fever.  HENT:   Positive for congestion. Negative for rhinorrhea, sinus pressure, sinus pain and sore throat.   Eyes:  Negative for visual disturbance.  Respiratory:  Positive for cough. Negative for chest tightness and shortness of breath.   Cardiovascular:  Negative for chest pain and palpitations.  Neurological:  Negative for dizziness and headaches.       Objective:    Physical Exam HENT:     Head: Normocephalic.     Right Ear: External ear normal.     Left Ear: External ear normal.     Nose: No nasal deformity or rhinorrhea.     Mouth/Throat:     Mouth: Mucous membranes are moist.     Tongue: Tongue does not deviate from midline.     Pharynx: No pharyngeal swelling, oropharyngeal exudate or posterior oropharyngeal erythema.  Cardiovascular:     Rate and Rhythm: Regular rhythm.     Heart sounds: No murmur heard. Pulmonary:     Effort: No respiratory distress.     Breath sounds: Normal breath sounds.  Neurological:     Mental Status: He is alert.     BP (!) 165/96   Pulse (!) 103   Ht 6' (1.829 m)   Wt 244 lb 1.9 oz (110.7 kg)   SpO2 95%   BMI 33.11 kg/m  Wt Readings from Last 3 Encounters:  10/11/23 244 lb 1.9 oz (110.7 kg)  05/11/23 234 lb 1.3 oz (106.2 kg)  06/16/22 236 lb (107 kg)       Assessment & Plan:  Viral illness Assessment & Plan: Pending COVID, flu, RSV Start taking Promethazine DM 5 mL by mouth every 4 hours as needed for cough and cold symptoms. Flonase nasal spray has been ordered for nasal congestion Increase fluid intake and allow for plenty of rest. Take Tylenol as needed for pain, fever, or general discomfort. Perform warm saltwater gargles 3-4 times daily to help with throat pain or discomfort. (Mix 1/2 teaspoon of salt in a glass of warm water and gargle several times daily to reduce throat inflammation and soothe irritation.) Ginger tea: Reduces throat irritation and can help with nausea. Look for sugar-free or honey-based throat lozenges to help with  a sore throat. Use a humidifier at bedtime to help with cough and nasal congestion. For nasal congestion: The use of heated humidified air is a safe and effective therapy. Saline nasal sprays may also help alleviate nasal symptoms of the common cold. Encouraged to follow-up if his symptoms worsens or fail to improve  Orders: -  COVID-19, Flu A+B and RSV -     Promethazine-DM; Take 5 mLs by mouth 4 (four) times daily as needed.  Dispense: 118 mL; Refill: 0 -     Fluticasone Propionate; Place 2 sprays into both nostrils daily.  Dispense: 16 g; Refill: 1  Note: This chart has been completed using Engineer, civil (consulting) software, and while attempts have been made to ensure accuracy, certain words and phrases may not be transcribed as intended.    Gilmore Laroche, FNP

## 2023-10-11 NOTE — Patient Instructions (Addendum)
I appreciate the opportunity to provide care to you today!  Viral Illness:  Start taking Promethazine DM 5 mL by mouth every 4 hours as needed for cough and cold symptoms.  Increase fluid intake and allow for plenty of rest Flonase nasal spray has been ordered for nasal congestion  Non-pharmacological interventions for nasal congestion   Steam Inhalation: Steam Therapy: Inhaling steam can help loosen mucus and reduce congestion. You can do this by taking a hot shower or inhaling steam from a bowl of hot water with a towel over your head to trap the steam. Nasal Irrigation: Saline Rinse: Using a saline nasal spray or a neti pot can help flush out mucus and allergens, reducing congestion. Make sure to use distilled, sterile, or previously boiled water for the rinse. Humidification: Humidifier: Using a humidifier in your home, especially in your bedroom, adds moisture to the air, which can help soothe irritated nasal passages and reduce congestion. Cool Mist Vaporizer: A cool mist vaporizer can also be effective in keeping your nasal passages moist. Hydration: Drink Plenty of Fluids: Staying well-hydrated helps thin mucus, making it easier to clear from the nasal passages. Warm fluids like tea, broth, or soup can be particularly soothing. Elevate Your Head: Sleep Position: Elevating your head while sleeping can help reduce nasal congestion by allowing mucus to drain more easily. Use an extra pillow or elevate the head of your bed. Warm Compress: Warm Compress: Applying a warm compress to the nose and forehead can help open the nasal passages and relieve sinus pressure. Avoid Irritants: Avoid Smoking and Strong Odors: Smoke, strong perfumes, and other irritants can exacerbate nasal congestion. Avoiding these can help reduce symptoms. Reduce Allergen Exposure: If allergies are contributing to your congestion, minimizing exposure to allergens like dust, pollen, and pet dander can be  helpful. Essential Oils: Peppermint or Eucalyptus Oil: Inhaling the vapors of peppermint or eucalyptus oil can help clear nasal passages. You can add a few drops to a diffuser or a bowl of hot water for steam inhalation. Breathing Exercises: Deep Breathing: Practicing deep breathing exercises can help improve airflow through the nasal passages and reduce congestion. Rest and Relaxation: Get Stanton of Rest: Ensuring adequate rest allows your body to recover and can help alleviate congestion, especially if it's related to a cold or respiratory infection.   Please follow up if your symptoms worsen or fail to improve with the current treatment regimen  Please continue to a heart-healthy diet and increase your physical activities. Try to exercise for at least five days a week.    It was a pleasure to see you and I look forward to continuing to work together on your health and well-being. Please do not hesitate to call the office if you need care or have questions about your care.  In case of emergency, please visit the Emergency Department for urgent care, or contact our clinic at [phone] to schedule an appointment. We're here to help you!   Have a wonderful day and week. With Gratitude, Gilmore Laroche MSN, FNP-BC

## 2023-10-11 NOTE — Assessment & Plan Note (Addendum)
Pending COVID, flu, RSV Start taking Promethazine DM 5 mL by mouth every 4 hours as needed for cough and cold symptoms. Flonase nasal spray has been ordered for nasal congestion Increase fluid intake and allow for plenty of rest. Take Tylenol as needed for pain, fever, or general discomfort. Perform warm saltwater gargles 3-4 times daily to help with throat pain or discomfort. (Mix 1/2 teaspoon of salt in a glass of warm water and gargle several times daily to reduce throat inflammation and soothe irritation.) Ginger tea: Reduces throat irritation and can help with nausea. Look for sugar-free or honey-based throat lozenges to help with a sore throat. Use a humidifier at bedtime to help with cough and nasal congestion. For nasal congestion: The use of heated humidified air is a safe and effective therapy. Saline nasal sprays may also help alleviate nasal symptoms of the common cold. Encouraged to follow-up if his symptoms worsens or fail to improve

## 2023-10-12 ENCOUNTER — Ambulatory Visit: Payer: Self-pay | Admitting: Family Medicine

## 2023-10-13 LAB — COVID-19, FLU A+B AND RSV
Influenza A, NAA: NOT DETECTED
Influenza B, NAA: NOT DETECTED
RSV, NAA: NOT DETECTED
SARS-CoV-2, NAA: NOT DETECTED

## 2023-12-03 ENCOUNTER — Other Ambulatory Visit: Payer: Self-pay | Admitting: Family Medicine

## 2024-01-01 ENCOUNTER — Other Ambulatory Visit: Payer: Self-pay | Admitting: Family Medicine

## 2024-01-01 DIAGNOSIS — K219 Gastro-esophageal reflux disease without esophagitis: Secondary | ICD-10-CM

## 2024-01-01 DIAGNOSIS — I10 Essential (primary) hypertension: Secondary | ICD-10-CM

## 2024-03-21 ENCOUNTER — Other Ambulatory Visit: Payer: Self-pay | Admitting: Family Medicine

## 2024-03-21 DIAGNOSIS — I10 Essential (primary) hypertension: Secondary | ICD-10-CM

## 2024-04-09 ENCOUNTER — Other Ambulatory Visit: Payer: Self-pay | Admitting: Family Medicine

## 2024-04-09 DIAGNOSIS — T7840XA Allergy, unspecified, initial encounter: Secondary | ICD-10-CM

## 2024-05-06 ENCOUNTER — Other Ambulatory Visit: Payer: Self-pay | Admitting: Family Medicine

## 2024-05-06 DIAGNOSIS — F339 Major depressive disorder, recurrent, unspecified: Secondary | ICD-10-CM

## 2024-05-06 DIAGNOSIS — I10 Essential (primary) hypertension: Secondary | ICD-10-CM

## 2024-06-04 ENCOUNTER — Other Ambulatory Visit: Payer: Self-pay | Admitting: Family Medicine

## 2024-06-20 ENCOUNTER — Other Ambulatory Visit: Payer: Self-pay | Admitting: Family Medicine

## 2024-06-20 DIAGNOSIS — M1A00X0 Idiopathic chronic gout, unspecified site, without tophus (tophi): Secondary | ICD-10-CM

## 2024-06-25 ENCOUNTER — Ambulatory Visit: Admitting: Family Medicine

## 2024-09-21 ENCOUNTER — Other Ambulatory Visit: Payer: Self-pay | Admitting: Family Medicine

## 2024-09-21 DIAGNOSIS — M1A00X0 Idiopathic chronic gout, unspecified site, without tophus (tophi): Secondary | ICD-10-CM

## 2024-09-21 DIAGNOSIS — I10 Essential (primary) hypertension: Secondary | ICD-10-CM

## 2024-10-08 ENCOUNTER — Ambulatory Visit: Admitting: Family Medicine

## 2024-10-10 ENCOUNTER — Other Ambulatory Visit: Payer: Self-pay | Admitting: Family Medicine

## 2024-10-10 DIAGNOSIS — F339 Major depressive disorder, recurrent, unspecified: Secondary | ICD-10-CM

## 2024-10-10 MED ORDER — VENLAFAXINE HCL ER 37.5 MG PO CP24: 37.5000 mg | ORAL_CAPSULE | Freq: Every day | ORAL | 2 refills | Status: AC

## 2024-10-10 NOTE — Telephone Encounter (Signed)
 Copied from CRM #[id number]. Topic: Clinical - Medication Refill >> Oct 10, 2024  9:06 AM Frederich PARAS wrote: Medication: venlafaxine  XR (EFFEXOR -XR) 37.5 MG 24 hr capsule  Has the patient contacted their pharmacy? Yes They adv they need to call us    This is the patient's preferred pharmacy:  [id number] [address] [facility] - [address] [place] [address] Phone: [phone] Fax: [phone]  Is this the correct pharmacy for this prescription? Yes If no, delete pharmacy and type the correct one.   Has the prescription been filled recently? Yes  Is the patient out of the medication? Yes, pt says he need it today   Has the patient been seen for an appointment in the last year OR does the patient have an upcoming appointment? Yes  Can we respond through MyChart? Yes  Agent: Please be advised that Rx refills may take up to 3 business days. We ask that you follow-up with your pharmacy.

## 2024-10-14 ENCOUNTER — Other Ambulatory Visit: Payer: Self-pay | Admitting: Family Medicine

## 2024-10-14 DIAGNOSIS — T7840XA Allergy, unspecified, initial encounter: Secondary | ICD-10-CM

## 2024-10-14 DIAGNOSIS — K219 Gastro-esophageal reflux disease without esophagitis: Secondary | ICD-10-CM

## 2024-10-19 ENCOUNTER — Ambulatory Visit (INDEPENDENT_AMBULATORY_CARE_PROVIDER_SITE_OTHER)

## 2024-10-19 ENCOUNTER — Other Ambulatory Visit: Payer: Self-pay | Admitting: Family Medicine

## 2024-10-19 VITALS — BP 172/112 | HR 114 | Ht 72.0 in | Wt 239.1 lb

## 2024-10-19 DIAGNOSIS — Z125 Encounter for screening for malignant neoplasm of prostate: Secondary | ICD-10-CM

## 2024-10-19 DIAGNOSIS — M1A00X0 Idiopathic chronic gout, unspecified site, without tophus (tophi): Secondary | ICD-10-CM

## 2024-10-19 DIAGNOSIS — N528 Other male erectile dysfunction: Secondary | ICD-10-CM

## 2024-10-19 DIAGNOSIS — I10 Essential (primary) hypertension: Secondary | ICD-10-CM

## 2024-10-19 DIAGNOSIS — E785 Hyperlipidemia, unspecified: Secondary | ICD-10-CM

## 2024-10-19 DIAGNOSIS — F339 Major depressive disorder, recurrent, unspecified: Secondary | ICD-10-CM | POA: Diagnosis not present

## 2024-10-19 DIAGNOSIS — Z23 Encounter for immunization: Secondary | ICD-10-CM | POA: Diagnosis not present

## 2024-10-19 DIAGNOSIS — R7301 Impaired fasting glucose: Secondary | ICD-10-CM

## 2024-10-19 MED ORDER — SILDENAFIL CITRATE 20 MG PO TABS: 20.0000 mg | ORAL_TABLET | ORAL | 5 refills | Status: AC | PRN

## 2024-10-19 MED ORDER — VENLAFAXINE HCL ER 75 MG PO CP24: 75.0000 mg | ORAL_CAPSULE | Freq: Every day | ORAL | 1 refills | Status: AC

## 2024-10-19 NOTE — Progress Notes (Unsigned)
" ° °  Established Patient Office Visit  Subjective   Patient ID: David Barrera, male    DOB: September 22, 1966  Age: 58 y.o. MRN: [id number]  Chief Complaint  Patient presents with   Medical Management of Chronic Issues    Pt here because he is been on a mild anti depressant (Effexor  XR) and he states its not working and dont know if it has ever really worked    HPI  Patient Active Problem List   Diagnosis Date Noted   Viral illness 10/11/2023   Paresthesia 05/11/2023   Hypercalcemia 06/16/2022   Bilateral lower extremity edema 05/05/2022   SOB (shortness of breath) on exertion 04/06/2022   Obesity (BMI 30-39.9) 10/01/2021   Chronic diarrhea 10/01/2021   Need for shingles vaccine 10/01/2021   Allergies 10/01/2021   Left knee pain 10/01/2021   Depression, recurrent 07/13/2021   Anxiety 07/13/2021   OSA (obstructive sleep apnea) 12/24/2020   Erectile dysfunction 01/30/2020   Chronic upper abdominal pain 01/19/2016   Gout 07/31/2015   Acid reflux 07/31/2015   Right knee pain 06/24/2015   Hyperlipidemia    Hypertension       ROS    Objective:     BP (!) 172/112   Pulse (!) 114   Ht 6' (1.829 m)   Wt 239 lb 1.9 oz (108.5 kg)   SpO2 95%   BMI 32.43 kg/m  BP Readings from Last 3 Encounters:  10/19/24 (!) 172/112  10/11/23 (!) 165/96  05/11/23 (!) 148/88   Wt Readings from Last 3 Encounters:  10/19/24 239 lb 1.9 oz (108.5 kg)  10/11/23 244 lb 1.9 oz (110.7 kg)  05/11/23 234 lb 1.3 oz (106.2 kg)     Physical Exam   No results found for any visits on 10/19/24.  {Labs (Optional):23779}  The 10-year ASCVD risk score (Arnett DK, et al., 2019) is: 21%    Assessment & Plan:   Problem List Items Addressed This Visit   None Visit Diagnoses       Encounter for immunization    -  Primary   Relevant Orders   Flu vaccine trivalent PF, 6mos and older(Flulaval,Afluria,Fluarix,Fluzone) (Completed)       No follow-ups on file.    Leita Longs, FNP  "

## 2024-10-21 NOTE — Assessment & Plan Note (Signed)
 Erectile dysfunction managed with sildenafil . Current dose may be insufficient. - Increased sildenafil  dose to 75 mg. - Sent [facility] to [facility].

## 2024-10-21 NOTE — Assessment & Plan Note (Signed)
 Recurrent depression with current symptoms. Current low-dose antidepressant may be ineffective. No suicidal ideation. - Increased antidepressant dose to 75 mg. - Sent [facility] to [facility]. - Advised to contact via MyChart for issues or ineffectiveness.

## 2024-10-21 NOTE — Assessment & Plan Note (Signed)
 Currently on rosuvastatin  40 mg daily.  Recheck fasting labs for reevaluation.

## 2024-10-21 NOTE — Assessment & Plan Note (Signed)
Currently controlled. Recheck labs next visit.

## 2024-10-22 ENCOUNTER — Telehealth: Payer: Self-pay | Admitting: Pharmacy Technician

## 2024-10-22 ENCOUNTER — Other Ambulatory Visit (HOSPITAL_COMMUNITY): Payer: Self-pay

## 2024-10-22 NOTE — Telephone Encounter (Signed)
 Pharmacy Patient Advocate Encounter   Received notification from Onbase that prior authorization for Sildenafil  Citrate (PAH) 20MG  tablets is required/requested.   Insurance verification completed.   The patient is insured through [employer].   Per test claim: PA required; PA submitted to above mentioned insurance via Latent Key/confirmation #/EOC [facility] Status is pending

## 2024-10-29 NOTE — Telephone Encounter (Signed)
 Pharmacy Patient Advocate Encounter  Received notification from [employer] that Prior Authorization for Sildenafil  Citrate (PAH) 20MG  tablets has been DENIED.  Full denial letter will be uploaded to the media tab. See denial reason below.   PA #/Case ID/Reference #: [id number]

## 2024-11-09 LAB — HEMOGLOBIN A1C
Est. average glucose Bld gHb Est-mCnc: 146 mg/dL
Hgb A1c MFr Bld: 6.7 % — ABNORMAL HIGH (ref 4.8–5.6)

## 2024-11-09 LAB — CMP14+EGFR
ALT: 19 IU/L (ref 0–44)
AST: 27 IU/L (ref 0–40)
Albumin: 4.5 g/dL (ref 3.8–4.9)
Alkaline Phosphatase: 46 IU/L — ABNORMAL LOW (ref 47–123)
BUN/Creatinine Ratio: 20 (ref 10–24)
BUN: 19 mg/dL (ref 8–27)
Bilirubin Total: 0.3 mg/dL (ref 0.0–1.2)
CO2: 26 mmol/L (ref 20–29)
Calcium: 9.5 mg/dL (ref 8.6–10.2)
Chloride: 99 mmol/L (ref 96–106)
Creatinine, Ser: 0.96 mg/dL (ref 0.76–1.27)
Globulin, Total: 2.3 g/dL (ref 1.5–4.5)
Glucose: 126 mg/dL — ABNORMAL HIGH (ref 70–99)
Potassium: 3.8 mmol/L (ref 3.5–5.2)
Sodium: 141 mmol/L (ref 134–144)
Total Protein: 6.8 g/dL (ref 6.0–8.5)
eGFR: 90 mL/min/1.73

## 2024-11-09 LAB — LIPID PANEL
Chol/HDL Ratio: 3.2 ratio (ref 0.0–5.0)
Cholesterol, Total: 174 mg/dL (ref 100–199)
HDL: 55 mg/dL
LDL Chol Calc (NIH): 71 mg/dL (ref 0–99)
Triglycerides: 300 mg/dL — ABNORMAL HIGH (ref 0–149)
VLDL Cholesterol Cal: 48 mg/dL — ABNORMAL HIGH (ref 5–40)

## 2024-11-09 LAB — PARATHYROID HORMONE, INTACT (NO CA): PTH: 23 pg/mL (ref 15–65)

## 2024-11-09 LAB — PSA: Prostate Specific Ag, Serum: 1.3 ng/mL (ref 0.0–4.0)

## 2024-11-09 NOTE — Telephone Encounter (Signed)
 Patient advised.

## 2024-11-27 NOTE — Telephone Encounter (Signed)
 LVM

## 2024-11-27 NOTE — Telephone Encounter (Signed)
 Please schedule a nurse visit to assess blood pressure prior to making any adjustments to blood pressure medications.

## 2024-11-28 ENCOUNTER — Ambulatory Visit: Payer: Self-pay

## 2024-11-28 NOTE — Progress Notes (Signed)
 Patient is in office today for a nurse visit for Blood Pressure Check. Patient blood pressure was 164/93, Patient No chest pain, No shortness of breath, No dyspnea on exertion, No orthopnea, No paroxysmal nocturnal dyspnea, No edema, No palpitations, No syncope

## 2024-12-07 ENCOUNTER — Other Ambulatory Visit: Payer: Self-pay | Admitting: Family Medicine

## 2024-12-07 DIAGNOSIS — I10 Essential (primary) hypertension: Secondary | ICD-10-CM

## 2024-12-07 MED ORDER — OLMESARTAN MEDOXOMIL 40 MG PO TABS: 40.0000 mg | ORAL_TABLET | Freq: Every day | ORAL | 1 refills | Status: AC

## 2025-02-22 ENCOUNTER — Ambulatory Visit: Payer: Self-pay
